# Patient Record
Sex: Male | Born: 1975 | Race: White | Hispanic: No | Marital: Married | Smoking: Current every day smoker
Health system: Southern US, Community
[De-identification: ages and names within clinical notes are randomized; demographics above are authoritative.]

## PROBLEM LIST (undated history)

## (undated) DIAGNOSIS — F419 Anxiety disorder, unspecified: Secondary | ICD-10-CM

## (undated) DIAGNOSIS — I10 Essential (primary) hypertension: Secondary | ICD-10-CM

## (undated) DIAGNOSIS — F32A Depression, unspecified: Secondary | ICD-10-CM

## (undated) DIAGNOSIS — F329 Major depressive disorder, single episode, unspecified: Secondary | ICD-10-CM

## (undated) DIAGNOSIS — K589 Irritable bowel syndrome without diarrhea: Secondary | ICD-10-CM

## (undated) DIAGNOSIS — G4730 Sleep apnea, unspecified: Secondary | ICD-10-CM

## (undated) DIAGNOSIS — K219 Gastro-esophageal reflux disease without esophagitis: Secondary | ICD-10-CM

## (undated) DIAGNOSIS — E785 Hyperlipidemia, unspecified: Secondary | ICD-10-CM

## (undated) HISTORY — DX: Hyperlipidemia, unspecified: E78.5

## (undated) HISTORY — PX: COLONOSCOPY WITH ESOPHAGOGASTRODUODENOSCOPY (EGD): SHX5779

## (undated) HISTORY — PX: HERNIA REPAIR: SHX51

---

## 2004-11-09 ENCOUNTER — Emergency Department: Payer: Self-pay | Admitting: Emergency Medicine

## 2004-11-10 ENCOUNTER — Inpatient Hospital Stay: Payer: Self-pay | Admitting: Internal Medicine

## 2004-11-10 ENCOUNTER — Other Ambulatory Visit: Payer: Self-pay

## 2006-11-09 ENCOUNTER — Emergency Department: Payer: Self-pay | Admitting: Emergency Medicine

## 2007-02-03 ENCOUNTER — Ambulatory Visit: Payer: Self-pay | Admitting: Internal Medicine

## 2007-08-02 ENCOUNTER — Emergency Department: Payer: Self-pay | Admitting: Emergency Medicine

## 2007-08-02 ENCOUNTER — Other Ambulatory Visit: Payer: Self-pay

## 2007-08-18 ENCOUNTER — Ambulatory Visit: Payer: Self-pay | Admitting: Gastroenterology

## 2009-05-17 ENCOUNTER — Emergency Department: Payer: Self-pay | Admitting: Emergency Medicine

## 2009-10-25 ENCOUNTER — Ambulatory Visit: Payer: Self-pay | Admitting: Gastroenterology

## 2012-01-31 ENCOUNTER — Ambulatory Visit: Payer: Self-pay | Admitting: Gastroenterology

## 2012-04-17 ENCOUNTER — Ambulatory Visit: Payer: Self-pay | Admitting: Internal Medicine

## 2012-10-06 ENCOUNTER — Ambulatory Visit: Payer: Self-pay | Admitting: Gastroenterology

## 2013-02-05 ENCOUNTER — Ambulatory Visit: Payer: Self-pay | Admitting: Anesthesiology

## 2013-02-10 ENCOUNTER — Ambulatory Visit: Payer: Self-pay | Admitting: Surgery

## 2013-10-02 DIAGNOSIS — G4733 Obstructive sleep apnea (adult) (pediatric): Secondary | ICD-10-CM | POA: Insufficient documentation

## 2013-10-02 DIAGNOSIS — K589 Irritable bowel syndrome without diarrhea: Secondary | ICD-10-CM | POA: Insufficient documentation

## 2013-10-02 DIAGNOSIS — I10 Essential (primary) hypertension: Secondary | ICD-10-CM | POA: Insufficient documentation

## 2013-10-02 DIAGNOSIS — F419 Anxiety disorder, unspecified: Secondary | ICD-10-CM | POA: Insufficient documentation

## 2014-05-15 NOTE — Op Note (Signed)
PATIENT NAME:  Steven Lucero, Steven Lucero MR#:  [id number] DATE OF BIRTH:  1975/05/15  DATE OF PROCEDURE:  02/10/2013  PREOPERATIVE DIAGNOSIS: Umbilical hernia.   POSTOPERATIVE DIAGNOSIS: Umbilical hernia.   PROCEDURE PERFORMED: Umbilical hernia repair.   SURGEON: Rochel Brome, M.D.   ANESTHESIA: General.   INDICATIONS: This 39 year old male has a recent history of umbilical pain. He described a cramping-type pain, hurts more with sitting, better when he lays down, and did have physical findings of umbilical hernia, which was palpable. The bulge itself was some 2 cm in dimension, and palpation reproduces pain and repair was recommended for definitive treatment.   DESCRIPTION OF PROCEDURE: The patient was placed on the operating table in the supine position under general anesthesia. The abdomen was prepared with ChloraPrep and draped in a sterile manner.   A transversely oriented supraumbilical incision was made some 5 cm in length, was transversely oriented and curvilinear and carried down through subcutaneous tissues. Several small bleeding points were cauterized and dissection was carried down to palpate the fascial defect. There was a hernia sac which was dissected free from surrounding structures. The sac was approximately 2 to 2.5 cm in length. It was dissected free from the skin of the umbilicus and was dissected free from the fascial ring defect and was inverted. The fascial ring defect appeared to have some satisfactory thickness of the fascia. The defect itself was approximately 1.2 cm in dimension. Next, polypropylene mesh was selected and cut to create a circular shape of some 1.5 cm in diameter. This was placed into the properitoneal plane and was sutured to the overlying fascia cranially and caudally with through and through 0 Surgilon. Next, the repair was carried out with a transversely oriented suture line of interrupted 0 Surgilon figure-of-eight sutures incorporating each suture into the  mesh. The repair looked good. Hemostasis was intact. Next, a pursestring suture was placed using 3-0 chromic in the subcutaneous tissues just about 2 cm superficial to the repair. Next, the skin of the umbilicus was sutured to the pursestring to tether the umbilicus. Next, the skin incision was closed with running 4-0 Monocryl subcuticular suture and Dermabond. The patient tolerated surgery satisfactorily and was then prepared for transfer to the recovery room.     ____________________________ Lenna Sciara. Rochel Brome, MD jws:dmm D: 02/10/2013 10:45:21 ET T: 02/10/2013 12:49:17 ET JOB#: [id number]  cc: Loreli Dollar, MD, <Dictator> Loreli Dollar MD ELECTRONICALLY SIGNED 02/11/2013 17:52

## 2014-09-30 ENCOUNTER — Other Ambulatory Visit: Payer: Self-pay | Admitting: Gastroenterology

## 2014-09-30 DIAGNOSIS — R1013 Epigastric pain: Secondary | ICD-10-CM

## 2014-09-30 DIAGNOSIS — R1310 Dysphagia, unspecified: Secondary | ICD-10-CM

## 2014-10-07 ENCOUNTER — Ambulatory Visit
Admission: RE | Admit: 2014-10-07 | Discharge: 2014-10-07 | Disposition: A | Discharge status: Home / Self Care | Payer: 59 | Source: Ambulatory Visit | Attending: Gastroenterology | Admitting: Gastroenterology

## 2014-10-07 DIAGNOSIS — R1013 Epigastric pain: Secondary | ICD-10-CM

## 2014-10-07 DIAGNOSIS — R1310 Dysphagia, unspecified: Secondary | ICD-10-CM

## 2014-10-07 DIAGNOSIS — K219 Gastro-esophageal reflux disease without esophagitis: Secondary | ICD-10-CM | POA: Diagnosis not present

## 2014-10-11 ENCOUNTER — Other Ambulatory Visit: Payer: Self-pay | Admitting: Gastroenterology

## 2014-10-11 DIAGNOSIS — R1084 Generalized abdominal pain: Secondary | ICD-10-CM

## 2014-10-15 ENCOUNTER — Ambulatory Visit
Admission: RE | Admit: 2014-10-15 | Discharge: 2014-10-15 | Disposition: A | Discharge status: Home / Self Care | Payer: 59 | Source: Ambulatory Visit | Attending: Gastroenterology | Admitting: Gastroenterology

## 2014-10-15 DIAGNOSIS — R1084 Generalized abdominal pain: Secondary | ICD-10-CM

## 2014-10-15 DIAGNOSIS — K760 Fatty (change of) liver, not elsewhere classified: Secondary | ICD-10-CM | POA: Insufficient documentation

## 2014-10-15 MED ORDER — TECHNETIUM TC 99M MEBROFENIN IV KIT
5.1900 | PACK | Freq: Once | INTRAVENOUS | Status: DC | PRN
  Administered 2014-10-15: 5.19 via INTRAVENOUS
  Filled 2014-10-15: qty 6

## 2014-11-05 ENCOUNTER — Encounter: Payer: Self-pay | Admitting: *Deleted

## 2014-11-08 ENCOUNTER — Ambulatory Visit
Admission: RE | Admit: 2014-11-08 | Discharge: 2014-11-08 | Disposition: A | Discharge status: Home / Self Care | Payer: 59 | Source: Ambulatory Visit | Attending: Gastroenterology | Admitting: Gastroenterology

## 2014-11-08 ENCOUNTER — Ambulatory Visit: Payer: 59 | Admitting: Anesthesiology

## 2014-11-08 ENCOUNTER — Encounter: Payer: Self-pay | Admitting: *Deleted

## 2014-11-08 ENCOUNTER — Encounter
Admission: RE | Disposition: A | Discharge status: Home / Self Care | Payer: Self-pay | Source: Ambulatory Visit | Attending: Gastroenterology

## 2014-11-08 DIAGNOSIS — F419 Anxiety disorder, unspecified: Secondary | ICD-10-CM | POA: Diagnosis not present

## 2014-11-08 DIAGNOSIS — K589 Irritable bowel syndrome without diarrhea: Secondary | ICD-10-CM | POA: Diagnosis not present

## 2014-11-08 DIAGNOSIS — Z87891 Personal history of nicotine dependence: Secondary | ICD-10-CM | POA: Diagnosis not present

## 2014-11-08 DIAGNOSIS — Z880 Allergy status to penicillin: Secondary | ICD-10-CM | POA: Diagnosis not present

## 2014-11-08 DIAGNOSIS — G4730 Sleep apnea, unspecified: Secondary | ICD-10-CM | POA: Insufficient documentation

## 2014-11-08 DIAGNOSIS — F329 Major depressive disorder, single episode, unspecified: Secondary | ICD-10-CM | POA: Insufficient documentation

## 2014-11-08 DIAGNOSIS — Z79899 Other long term (current) drug therapy: Secondary | ICD-10-CM | POA: Insufficient documentation

## 2014-11-08 DIAGNOSIS — K2950 Unspecified chronic gastritis without bleeding: Secondary | ICD-10-CM | POA: Diagnosis not present

## 2014-11-08 DIAGNOSIS — K3189 Other diseases of stomach and duodenum: Secondary | ICD-10-CM | POA: Diagnosis not present

## 2014-11-08 DIAGNOSIS — I10 Essential (primary) hypertension: Secondary | ICD-10-CM | POA: Diagnosis not present

## 2014-11-08 DIAGNOSIS — Z6841 Body Mass Index (BMI) 40.0 and over, adult: Secondary | ICD-10-CM | POA: Insufficient documentation

## 2014-11-08 DIAGNOSIS — K219 Gastro-esophageal reflux disease without esophagitis: Secondary | ICD-10-CM | POA: Insufficient documentation

## 2014-11-08 DIAGNOSIS — R1013 Epigastric pain: Secondary | ICD-10-CM | POA: Diagnosis present

## 2014-11-08 HISTORY — PX: ESOPHAGOGASTRODUODENOSCOPY (EGD) WITH PROPOFOL: SHX5813

## 2014-11-08 HISTORY — DX: Gastro-esophageal reflux disease without esophagitis: K21.9

## 2014-11-08 HISTORY — DX: Irritable bowel syndrome, unspecified: K58.9

## 2014-11-08 HISTORY — DX: Sleep apnea, unspecified: G47.30

## 2014-11-08 HISTORY — DX: Anxiety disorder, unspecified: F41.9

## 2014-11-08 HISTORY — DX: Major depressive disorder, single episode, unspecified: F32.9

## 2014-11-08 HISTORY — DX: Essential (primary) hypertension: I10

## 2014-11-08 HISTORY — DX: Depression, unspecified: F32.A

## 2014-11-08 SURGERY — ESOPHAGOGASTRODUODENOSCOPY (EGD) WITH PROPOFOL
Anesthesia: General

## 2014-11-08 MED ORDER — PROPOFOL 10 MG/ML IV BOLUS
INTRAVENOUS | Status: DC | PRN
  Administered 2014-11-08 (×11): 20 mg via INTRAVENOUS

## 2014-11-08 MED ORDER — SODIUM CHLORIDE 0.9 % IV SOLN
INTRAVENOUS | Status: DC
Start: 2014-11-08 — End: 2014-11-08

## 2014-11-08 MED ORDER — MIDAZOLAM HCL 2 MG/2ML IJ SOLN
INTRAMUSCULAR | Status: DC | PRN
  Administered 2014-11-08: 1 mg via INTRAVENOUS

## 2014-11-08 MED ORDER — SODIUM CHLORIDE 0.9 % IV SOLN
INTRAVENOUS | Status: DC
  Administered 2014-11-08: 11:00:00 via INTRAVENOUS

## 2014-11-08 MED ORDER — GLYCOPYRROLATE 0.2 MG/ML IJ SOLN
INTRAMUSCULAR | Status: DC | PRN
Start: 2014-11-08 — End: 2014-11-30
  Administered 2014-11-08: 0.4 mg via INTRAVENOUS

## 2014-11-08 NOTE — Op Note (Signed)
[facility] Gastroenterology Patient Name: Steven Lucero Procedure Date: 11/08/2014 11:06 AM MRN: [id number] Account #: [id number] Date of Birth: 19-Jan-1976 Admit Type: Outpatient Age: 39 Room: [facility] ENDO ROOM 3 Gender: Male Note Status: Finalized Procedure:         Upper GI endoscopy Indications:       Epigastric abdominal pain Providers:         Lollie Sails, MD Referring MD:      Caprice Renshaw (Referring MD) Medicines:         Monitored Anesthesia Care Complications:     No immediate complications. Procedure:         Pre-Anesthesia Assessment:                    - ASA Grade Assessment: III - A patient with severe                     systemic disease.                    After obtaining informed consent, the endoscope was passed                     under direct vision. Throughout the procedure, the                     patient's blood pressure, pulse, and oxygen saturations                     were monitored continuously. The Olympus GIF-160 endoscope                     (S#. [id number]) was introduced through the mouth, and                     advanced to the third part of duodenum. The upper GI                     endoscopy was accomplished without difficulty. The upper                     GI endoscopy was accomplished without difficulty. The                     patient tolerated the procedure well. Findings:      The examined esophagus was normal. Biopsies were taken with a cold       forceps for histology from the GE junction.      The entire examined stomach was normal. Biopsies were taken with a cold       forceps for histology. Biopsies were taken with a cold forceps for       Helicobacter pylori testing.      The cardia and gastric fundus were normal on retroflexion.      The examined duodenum was normal. Biopsies were taken with a cold       forceps for histology. Impression:        - Normal esophagus. Biopsied.                    - Normal  stomach. Biopsied.                    - Normal examined duodenum. Biopsied. Recommendation:    - Use Aciphex (rabeprazole) 20 mg PO BID.                    -  Use sucralfate tablets 1 gram PO QID daily.                    - Return to GI clinic in 1 month. Procedure Code(s): --- Professional ---                    [phone], Esophagogastroduodenoscopy, flexible, transoral;                     with biopsy, single or multiple Diagnosis Code(s): --- Professional ---                    789.06, Abdominal pain, epigastric CPT copyright 2014 American Medical Association. All rights reserved. The codes documented in this report are preliminary and upon coder review may  be revised to meet current compliance requirements. Lollie Sails, MD 11/08/2014 11:29:38 AM This report has been signed electronically. Number of Addenda: 0 Note Initiated On: 11/08/2014 11:06 AM      [facility]

## 2014-11-08 NOTE — H&P (Signed)
Outpatient short stay form Pre-procedure 11/08/2014 11:03 AM Lollie Sails MD  Primary Physician: Dr. Derinda Late  Reason for visit:  EGD  History of present illness:  Patient is a 39 year old male presenting with complaint of increasing epigastric pain over the period past 2 months. He is been tried on several medications that does not seem to be helping. Is burning in nature. There is some nausea. There's no emesis. It is nonradiating. He takes no aspirin or NSAID products. He takes no anticoagulation medications.  He had a upper GI series with barium swallow done on 10/07/2014 some mild gastroesophageal reflux otherwise normal. The standardized barium tablet passed into the stomach without difficulty.  Current facility-administered medications:  .  0.9 %  sodium chloride infusion, , Intravenous, Continuous, Lollie Sails, MD, Last Rate: 20 mL/hr at 11/08/14 1031 .  0.9 %  sodium chloride infusion, , Intravenous, Continuous, Lollie Sails, MD  Facility-Administered Medications Ordered in Other Encounters:  .  glycopyrrolate (ROBINUL) injection, , , Anesthesia Intra-op, Molli Barrows, MD, 0.4 mg at 11/08/14 1053  Prescriptions prior to admission  Medication Sig Dispense Refill Last Dose  . amLODipine (NORVASC) 5 MG tablet Take 5 mg by mouth daily.   11/07/2014 at 2100  . buPROPion (WELLBUTRIN) 100 MG tablet Take 100 mg by mouth 2 (two) times daily.   11/07/2014 at Unknown time  . clomiPHENE (CLOMID) 50 MG tablet Take by mouth daily.   11/07/2014 at Unknown time  . fluticasone (FLONASE) 50 MCG/ACT nasal spray Place into both nostrils daily.   11/07/2014 at Unknown time  . lansoprazole (PREVACID) 30 MG capsule Take 30 mg by mouth daily at 12 noon.   11/07/2014 at Unknown time  . losartan-hydrochlorothiazide (HYZAAR) 50-12.5 MG tablet Take 1 tablet by mouth daily.   11/08/2014 at 0600  . Probiotic Product (ALIGN) 4 MG CAPS Take 4 mg by mouth.   11/07/2014 at Unknown time  .  ranitidine (ZANTAC) 150 MG capsule Take 150 mg by mouth 2 (two) times daily.   11/07/2014 at 0600  . escitalopram (LEXAPRO) 20 MG tablet Take 20 mg by mouth daily.   Not Taking  . pantoprazole (PROTONIX) 40 MG tablet Take 40 mg by mouth daily.   Completed Course at Unknown time  . sucralfate (CARAFATE) 1 G tablet Take 1 g by mouth 4 (four) times daily -  with meals and at bedtime.   Not Taking at Unknown time  . varenicline (CHANTIX) 0.5 MG tablet Take 0.5 mg by mouth 2 (two) times daily.   Not Taking at Unknown time     Allergies  Allergen Reactions  . Augmentin [Amoxicillin-Pot Clavulanate]   . Penicillins      Past Medical History  Diagnosis Date  . Hypertension   . GERD (gastroesophageal reflux disease)   . Anxiety   . Sleep apnea   . IBS (irritable bowel syndrome)   . Depression     Review of systems:      Physical Exam    Heart and lungs: Regular rate and rhythm without rub or gallop, lungs are bilaterally clear    HEENT: Normocephalic atraumatic eyes are anicteric    Other:     Pertinant exam for procedure: Obese, mild tenderness to palpation the epigastric region. It is not possible to palpate internal organs. Bowel sounds are positive normoactive. There is no rebound.    Planned proceedures: EGD and indicated procedures I have discussed the risks benefits and complications of procedures to  include not limited to bleeding, infection, perforation and the risk of sedation and the patient wishes to proceed.    Lollie Sails, MD Gastroenterology 11/08/2014  11:03 AM

## 2014-11-08 NOTE — Anesthesia Preprocedure Evaluation (Addendum)
Anesthesia Evaluation  Patient identified by MRN, date of birth, ID band Patient awake    Reviewed: Allergy & Precautions, H&P , NPO status , Patient's Chart, lab work & pertinent test results, reviewed documented beta blocker date and time   Airway Mallampati: II  TM Distance: >3 FB Neck ROM: full    Dental no notable dental hx.    Pulmonary neg pulmonary ROS, sleep apnea , former smoker,    Pulmonary exam normal breath sounds clear to auscultation       Cardiovascular Exercise Tolerance: Good hypertension, negative cardio ROS   Rhythm:regular Rate:Normal     Neuro/Psych PSYCHIATRIC DISORDERS negative neurological ROS  negative psych ROS   GI/Hepatic negative GI ROS, Neg liver ROS, GERD  ,  Endo/Other  negative endocrine ROSMorbid obesity  Renal/GU negative Renal ROS  negative genitourinary   Musculoskeletal   Abdominal   Peds  Hematology negative hematology ROS (+)   Anesthesia Other Findings   Reproductive/Obstetrics negative OB ROS                           Anesthesia Physical Anesthesia Plan  ASA: III  Anesthesia Plan: General   Post-op Pain Management:    Induction:   Airway Management Planned:   Additional Equipment:   Intra-op Plan:   Post-operative Plan:   Informed Consent: I have reviewed the patients History and Physical, chart, labs and discussed the procedure including the risks, benefits and alternatives for the proposed anesthesia with the patient or authorized representative who has indicated his/her understanding and acceptance.   Dental Advisory Given  Plan Discussed with: CRNA  Anesthesia Plan Comments:         Anesthesia Quick Evaluation

## 2014-11-09 LAB — SURGICAL PATHOLOGY

## 2014-11-10 ENCOUNTER — Encounter: Payer: Self-pay | Admitting: Gastroenterology

## 2014-11-19 ENCOUNTER — Other Ambulatory Visit: Payer: Self-pay | Admitting: Gastroenterology

## 2014-11-19 DIAGNOSIS — R109 Unspecified abdominal pain: Secondary | ICD-10-CM

## 2014-11-30 ENCOUNTER — Ambulatory Visit: Payer: 59

## 2014-11-30 ENCOUNTER — Encounter: Payer: Self-pay | Admitting: Gastroenterology

## 2014-11-30 NOTE — Anesthesia Postprocedure Evaluation (Signed)
  Anesthesia Post-op Note  Patient: Steven Lucero  Procedure(s) Performed: Procedure(s): ESOPHAGOGASTRODUODENOSCOPY (EGD) WITH PROPOFOL (N/A)  Anesthesia type:General  Patient location: PACU  Post pain: Pain level controlled  Post assessment: Post-op Vital signs reviewed, Patient's Cardiovascular Status Stable, Respiratory Function Stable, Patent Airway and No signs of Nausea or vomiting  Post vital signs: Reviewed and stable  Last Vitals:  Filed Vitals:   11/08/14 1210  BP: 126/67  Pulse: 62  Temp:   Resp: 21    Level of consciousness: awake, alert  and patient cooperative  Complications: No apparent anesthesia complications

## 2014-11-30 NOTE — Transfer of Care (Signed)
Immediate Anesthesia Transfer of Care Note  Patient: Steven Lucero  Procedure(s) Performed: Procedure(s): ESOPHAGOGASTRODUODENOSCOPY (EGD) WITH PROPOFOL (N/A)  Patient Location: PACU and Endoscopy Unit    Anesthesia Type:General  Level of Consciousness: awake  Airway & Oxygen Therapy: Patient Spontanous Breathing  Post-op Assessment: Report given to RN and Post -op Vital signs reviewed and stable  Post vital signs: Reviewed  Last Vitals:  Filed Vitals:   11/08/14 1210  BP: 126/67  Pulse: 62  Temp:   Resp: 21    Complications: No apparent anesthesia complications

## 2014-12-02 ENCOUNTER — Other Ambulatory Visit: Payer: Self-pay | Admitting: Internal Medicine

## 2014-12-02 DIAGNOSIS — R109 Unspecified abdominal pain: Secondary | ICD-10-CM

## 2014-12-02 DIAGNOSIS — R0602 Shortness of breath: Secondary | ICD-10-CM

## 2014-12-02 DIAGNOSIS — G4730 Sleep apnea, unspecified: Secondary | ICD-10-CM

## 2014-12-09 ENCOUNTER — Encounter
Admission: RE | Admit: 2014-12-09 | Discharge: 2014-12-09 | Disposition: A | Discharge status: Home / Self Care | Payer: 59 | Source: Ambulatory Visit | Attending: Internal Medicine | Admitting: Internal Medicine

## 2014-12-09 ENCOUNTER — Encounter: Admission: RE | Admit: 2014-12-09 | Payer: 59 | Source: Ambulatory Visit

## 2014-12-09 DIAGNOSIS — G4730 Sleep apnea, unspecified: Secondary | ICD-10-CM | POA: Diagnosis present

## 2014-12-09 DIAGNOSIS — R109 Unspecified abdominal pain: Secondary | ICD-10-CM | POA: Insufficient documentation

## 2014-12-09 DIAGNOSIS — R0602 Shortness of breath: Secondary | ICD-10-CM | POA: Diagnosis not present

## 2014-12-09 MED ORDER — TECHNETIUM TC 99M SESTAMIBI - CARDIOLITE
30.0000 | Freq: Once | INTRAVENOUS | Status: AC | PRN
  Administered 2014-12-09: 32.61 via INTRAVENOUS

## 2014-12-09 MED ORDER — REGADENOSON 0.4 MG/5ML IV SOLN
0.4000 mg | Freq: Once | INTRAVENOUS | Status: AC
  Administered 2014-12-09: 0.4 mg via INTRAVENOUS

## 2014-12-10 ENCOUNTER — Encounter
Admission: RE | Admit: 2014-12-10 | Discharge: 2014-12-10 | Disposition: A | Discharge status: Home / Self Care | Payer: 59 | Source: Ambulatory Visit | Attending: Internal Medicine | Admitting: Internal Medicine

## 2014-12-10 DIAGNOSIS — R0602 Shortness of breath: Secondary | ICD-10-CM | POA: Diagnosis not present

## 2014-12-10 MED ORDER — TECHNETIUM TC 99M SESTAMIBI - CARDIOLITE
30.0000 | Freq: Once | INTRAVENOUS | Status: AC | PRN
  Administered 2014-12-10: 28.65 via INTRAVENOUS

## 2014-12-11 LAB — NM MYOCAR MULTI W/SPECT W/WALL MOTION / EF
CHL CUP NUCLEAR SDS: 13
CHL CUP NUCLEAR SRS: 9
CHL CUP NUCLEAR SSS: 21
LV sys vol: 72 mL
LVDIAVOL: 148 mL
NUC STRESS TID: 1.24
Peak HR: 100 {beats}/min
Rest HR: 68 {beats}/min

## 2014-12-15 ENCOUNTER — Ambulatory Visit: Payer: 59

## 2014-12-17 ENCOUNTER — Ambulatory Visit
Admission: RE | Admit: 2014-12-17 | Discharge: 2014-12-17 | Disposition: A | Discharge status: Home / Self Care | Payer: 59 | Source: Ambulatory Visit | Attending: Gastroenterology | Admitting: Gastroenterology

## 2014-12-17 DIAGNOSIS — R109 Unspecified abdominal pain: Secondary | ICD-10-CM | POA: Diagnosis present

## 2014-12-17 MED ORDER — IOHEXOL 350 MG/ML SOLN
150.0000 mL | Freq: Once | INTRAVENOUS | Status: AC | PRN
  Administered 2014-12-17: 125 mL via INTRAVENOUS

## 2015-01-11 ENCOUNTER — Ambulatory Visit (INDEPENDENT_AMBULATORY_CARE_PROVIDER_SITE_OTHER): Payer: 59 | Admitting: Licensed Clinical Social Worker

## 2015-01-11 DIAGNOSIS — F329 Major depressive disorder, single episode, unspecified: Secondary | ICD-10-CM

## 2015-01-11 DIAGNOSIS — F411 Generalized anxiety disorder: Secondary | ICD-10-CM

## 2015-01-11 DIAGNOSIS — F32A Depression, unspecified: Secondary | ICD-10-CM
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
## 2015-02-03 ENCOUNTER — Ambulatory Visit (INDEPENDENT_AMBULATORY_CARE_PROVIDER_SITE_OTHER): Payer: 59 | Admitting: Psychiatry

## 2015-02-03 ENCOUNTER — Encounter: Payer: Self-pay | Admitting: Psychiatry

## 2015-02-03 VITALS — BP 130/98 | HR 102 | Temp 97.2°F | Ht 76.0 in | Wt >= 6400 oz

## 2015-02-03 DIAGNOSIS — F331 Major depressive disorder, recurrent, moderate: Secondary | ICD-10-CM | POA: Diagnosis not present

## 2015-02-03 MED ORDER — BUPROPION HCL ER (XL) 300 MG PO TB24: 300.0000 mg | ORAL_TABLET | Freq: Every day | ORAL | Status: DC

## 2015-02-03 MED ORDER — DULOXETINE HCL 30 MG PO CPEP: 30.0000 mg | ORAL_CAPSULE | Freq: Two times a day (BID) | ORAL | Status: DC

## 2015-02-03 NOTE — Progress Notes (Signed)
Psychiatric Initial Adult Assessment   Patient Identification: Steven Lucero MRN:  [id number] Date of Evaluation:  02/03/2015 Referral Source: Elmyra Ricks- Therapist Chief Complaint:   Chief Complaint    Establish Care; Obesity; Anxiety; Depression; Stress     Visit Diagnosis:    ICD-9-CM ICD-10-CM   1. MDD (major depressive disorder), recurrent episode, moderate (HCC) 296.32 F33.1    Diagnosis:   Patient Active Problem List   Diagnosis Date Noted  . Anxiety [F41.9] 10/02/2013  . Benign essential HTN [I10] 10/02/2013  . 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 Obstructive apnea [G47.33] 10/02/2013   History of Present Illness:  Patient is a 40 year old morbidly obese male who currently works at "presented for initial assessment. He reported that he is coming here for the evaluation of his anxiety symptoms and negative thoughts about himself. He reported that he is currently taking medications including Lexapro and Wellbutrin but has not taking his Lexapro in the past 2 weeks. He reported that he was having adverse effects from Lexapro including sexual dysfunction and weight gain. He is currently overweight and he related that he has gained over 60 pounds by taking Lexapro. Patient reported that his friend has also been taking Lexapro and he has gained some weight on the medication. Patient reported that he was suggested to come for the psychiatric evaluation so he can have a second opinion about his medications. 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 He reported that he has not changed anything else in his life. He has good relationship with his wife who also works in [employer]. a but in a different office. Patient reported that he has difficult time sleeping and keeps thinking about different things. He currently denied using any drugs or alcohol. He denied having any suicidal homicidal ideations or  plans. He appeared calm and coherent during the interview.   Elements:  Severity:  mild. Associated Signs/Symptoms: Depression Symptoms:  anhedonia, insomnia, psychomotor retardation, fatigue, feelings of worthlessness/guilt, hopelessness, anxiety, panic attacks, loss of energy/fatigue, disturbed sleep, (Hypo) Manic Symptoms:  Flight of Ideas, Anxiety Symptoms:  Excessive Worry, Panic Symptoms, Psychotic Symptoms:  none PTSD Symptoms: Negative NA  Past Medical History:  Past Medical History  Diagnosis Date  . Hypertension   . GERD (gastroesophageal reflux disease)   . Anxiety   . Sleep apnea   . IBS (irritable bowel syndrome)   . Depression   . Hyperlipidemia     Past Surgical History  Procedure Laterality Date  . Hernia repair    . Colonoscopy with esophagogastroduodenoscopy (egd)    . Esophagogastroduodenoscopy (egd) with propofol N/A 11/08/2014    Procedure: ESOPHAGOGASTRODUODENOSCOPY (EGD) WITH PROPOFOL;  Surgeon: Lollie Sails, MD;  Location: [facility] ENDOSCOPY;  Service: Endoscopy;  Laterality: N/A;   Family History:  Family History  Problem Relation Age of Onset  . Heart attack Father   . Heart disease Father   . Diabetes Father    Social History:   Social History   Social History  . Marital Status: Married    Spouse Name: N/A  . Number of Children: N/A  . Years of Education: N/A   Social History Main Topics  . Smoking status: Current Every Day Smoker    Types: E-cigarettes  . Smokeless tobacco: Current User    Types: Snuff  . Alcohol Use: No  . Drug Use: No  . Sexual Activity: Yes  Birth Control/ Protection: None   Other Topics Concern  . 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  Married x 11 years Has a 21 years yo. She works at [employer] as well.   Musculoskeletal: Strength & Muscle Tone: within normal limits Gait & Station: normal Patient  leans: N/A  Psychiatric Specialty Exam: HPI  ROS  Blood pressure 130/98, pulse 102, temperature 97.2 F (36.2 C), temperature source Tympanic, height 6\' 4"  (1.93 m), weight 419 lb 9.6 oz (190.329 kg), SpO2 95 %.Body mass index is 51.1 kg/(m^2).  General Appearance: Casual  Eye Contact:  Fair  Speech:  Clear and Coherent  Volume:  Normal  Mood:  Anxious  Affect:  Congruent  Thought Process:  Coherent  Orientation:  Full (Time, Place, and Person)  Thought Content:  WDL  Suicidal Thoughts:  No  Homicidal Thoughts:  No  Memory:  Immediate;   Fair  Judgement:  Fair  Insight:  Fair  Psychomotor Activity:  Normal  Concentration:  Fair  Recall:  [employer] of Knowledge:Fair  Language: Fair  Akathisia:  No  Handed:  Right  AIMS (if indicated):    Assets:  Communication Skills Desire for Improvement Physical Health Social Support  ADL's:  Intact  Cognition: WNL    Sleep: " Not great "   Is the patient at risk to self?  No. Has the patient been a risk to self in the past 6 months?  No. Has the patient been a risk to self within the distant past?  No. Is the patient a risk to others?  No. Has the patient been a risk to others in the past 6 months?  No. Has the patient been a risk to others within the distant past?  No.  Allergies:   Allergies  Allergen Reactions  . Augmentin [Amoxicillin-Pot Clavulanate]   . Penicillin G Other (See Comments)  . Penicillins    Current Medications: Current Outpatient Prescriptions  Medication Sig Dispense Refill  . amLODipine (NORVASC) 5 MG tablet Take 5 mg by mouth daily.    Marland Kitchen buPROPion (WELLBUTRIN XL) 300 MG 24 hr tablet TAKE 1 TABLET (300 MG TOTAL) BY MOUTH ONCE DAILY.  1  . escitalopram (LEXAPRO) 20 MG tablet Take 20 mg by mouth daily.    Marland Kitchen losartan-hydrochlorothiazide (HYZAAR) 50-12.5 MG tablet Take 1 tablet by mouth daily.    . Probiotic Product (ALIGN) 4 MG CAPS Take 4 mg by mouth.    . RABEprazole (ACIPHEX) 20 MG tablet TAKE 1  TAB 30-45 MIN BEFORE BREAKFAST & 1 TAB 30-45 MIN BEFORE DINNER FOR 1 MONTH THEN ONCE A DAY  2  . ranitidine (ZANTAC) 150 MG capsule Take 150 mg by mouth 2 (two) times daily.    . sucralfate (CARAFATE) 1 G tablet Take 1 g by mouth 4 (four) times daily -  with meals and at bedtime.     No current facility-administered medications for this visit.    Previous Psychotropic Medications: Buspar, Zoloft, Paroxetine, Lexapro. Xanax.   Substance Abuse History in the last 12 months:  No.  Consequences of Substance Abuse: Negative NA  Medical Decision Making:  Review of Psycho-Social Stressors (1)  Treatment Plan Summary: Medication management   Discussed with patient at length about the several different treatment options. He reported that he wants to try a different medication. I discussed with him about the option of starting Pristiq or Cymbalta.  Pristiq is nonformulary with his insurance. I will start him on Cymbalta 30 mg for 2 days and then titrate to 60 mg daily. We discussed about the side effects in detail and he became concerned about the side effects due to his negative thoughts. However he agreed to the trial of the medication. He will continue on Wellbutrin XL 300 mg in the morning as he reported that it is helping him with his tobacco cessation. He will follow up in 4-5 weeks.    Rainey Pines, MD    1/12/20173:17 PM

## 2015-02-07 ENCOUNTER — Ambulatory Visit: Payer: Self-pay | Admitting: Psychiatry

## 2015-02-09 ENCOUNTER — Telehealth: Payer: Self-pay

## 2015-02-09 NOTE — Telephone Encounter (Signed)
pt called states that the wellbutrin is not helping with the stop smoking.    and he is also having an issue with the cymbalta, he is feeling strange , tired, sleepy, shaking wants to know if he can stop these medications?    also wants to know if he need to go back on lexapro

## 2015-02-10 MED ORDER — HYDROXYZINE PAMOATE 25 MG PO CAPS
100.0000 mg | ORAL_CAPSULE | Freq: Two times a day (BID) | ORAL | Status: DC | PRN
Start: 2015-02-10 — End: 2015-10-13

## 2015-02-10 NOTE — Telephone Encounter (Signed)
Called pt. He stated that he is feeling tired since starting Cymbalta and taking both Cymbalta and Wellbutrin at same time in am. Advised him to take Cymbalta in pm. He was asking about Lexapro which he has not taken in 3 weeks. Advised him to continue with Cymbalta at this time and he agreed with the plan.  He was asking for a prescription of Xanax for anxiety.  Offered Vistaril 25mg  po BID prn for anxiety and he agreed. Prescription sent to Pharmacy.  Will follow.

## 2015-02-10 NOTE — Telephone Encounter (Signed)
pt called wanted to know if it was ok to stop the medication. He states he had a hard time last night and he not going to work today he doesn't feel like he can drive.  pt want to know if ok to just stop medication or does he need to tritrate off

## 2015-02-14 ENCOUNTER — Telehealth: Payer: Self-pay

## 2015-02-14 NOTE — Telephone Encounter (Signed)
pt called states he wanted to speak back with dr. Gretel Acre.  he states he left a message on dr. Gretel Acre voice mail. pt states he is not doing good and wants to speak back with dr. Gretel Acre

## 2015-02-14 NOTE — Telephone Encounter (Signed)
left message to call office back, with more details of what is going on with medications, advised to make appt to discuss issues

## 2015-06-27 ENCOUNTER — Encounter: Payer: Self-pay | Admitting: *Deleted

## 2015-06-28 ENCOUNTER — Ambulatory Visit
Admission: RE | Admit: 2015-06-28 | Discharge: 2015-06-28 | Disposition: A | Discharge status: Home / Self Care | Payer: 59 | Source: Ambulatory Visit | Attending: Gastroenterology | Admitting: Gastroenterology

## 2015-06-28 ENCOUNTER — Ambulatory Visit: Payer: 59 | Admitting: Anesthesiology

## 2015-06-28 ENCOUNTER — Encounter
Admission: RE | Disposition: A | Discharge status: Home / Self Care | Payer: Self-pay | Source: Ambulatory Visit | Attending: Gastroenterology

## 2015-06-28 DIAGNOSIS — E785 Hyperlipidemia, unspecified: Secondary | ICD-10-CM | POA: Diagnosis not present

## 2015-06-28 DIAGNOSIS — K219 Gastro-esophageal reflux disease without esophagitis: Secondary | ICD-10-CM | POA: Diagnosis not present

## 2015-06-28 DIAGNOSIS — I10 Essential (primary) hypertension: Secondary | ICD-10-CM | POA: Diagnosis not present

## 2015-06-28 DIAGNOSIS — K589 Irritable bowel syndrome without diarrhea: Secondary | ICD-10-CM | POA: Diagnosis not present

## 2015-06-28 DIAGNOSIS — G4730 Sleep apnea, unspecified: Secondary | ICD-10-CM | POA: Diagnosis not present

## 2015-06-28 DIAGNOSIS — Z79899 Other long term (current) drug therapy: Secondary | ICD-10-CM | POA: Diagnosis not present

## 2015-06-28 DIAGNOSIS — F329 Major depressive disorder, single episode, unspecified: Secondary | ICD-10-CM | POA: Diagnosis not present

## 2015-06-28 DIAGNOSIS — F17200 Nicotine dependence, unspecified, uncomplicated: Secondary | ICD-10-CM | POA: Insufficient documentation

## 2015-06-28 DIAGNOSIS — R194 Change in bowel habit: Secondary | ICD-10-CM | POA: Diagnosis not present

## 2015-06-28 DIAGNOSIS — R1084 Generalized abdominal pain: Secondary | ICD-10-CM | POA: Diagnosis not present

## 2015-06-28 DIAGNOSIS — Z880 Allergy status to penicillin: Secondary | ICD-10-CM | POA: Diagnosis not present

## 2015-06-28 DIAGNOSIS — F419 Anxiety disorder, unspecified: Secondary | ICD-10-CM | POA: Diagnosis not present

## 2015-06-28 HISTORY — PX: COLONOSCOPY WITH PROPOFOL: SHX5780

## 2015-06-28 SURGERY — COLONOSCOPY WITH PROPOFOL
Anesthesia: General

## 2015-06-28 MED ORDER — SODIUM CHLORIDE 0.9 % IV SOLN
INTRAVENOUS | Status: DC
  Administered 2015-06-28 (×2): via INTRAVENOUS

## 2015-06-28 MED ORDER — PROPOFOL 500 MG/50ML IV EMUL
INTRAVENOUS | Status: DC | PRN
  Administered 2015-06-28: 125 ug/kg/min via INTRAVENOUS

## 2015-06-28 MED ORDER — FENTANYL CITRATE (PF) 100 MCG/2ML IJ SOLN
INTRAMUSCULAR | Status: DC | PRN
  Administered 2015-06-28: 50 ug via INTRAVENOUS

## 2015-06-28 MED ORDER — PROPOFOL 10 MG/ML IV BOLUS
INTRAVENOUS | Status: DC | PRN
  Administered 2015-06-28: 80 mg via INTRAVENOUS

## 2015-06-28 MED ORDER — MIDAZOLAM HCL 2 MG/2ML IJ SOLN
INTRAMUSCULAR | Status: DC | PRN
  Administered 2015-06-28: 1 mg via INTRAVENOUS

## 2015-06-28 MED ORDER — SODIUM CHLORIDE 0.9 % IV SOLN: INTRAVENOUS | Status: DC

## 2015-06-28 NOTE — Anesthesia Procedure Notes (Signed)
Date/Time: 06/28/2015 10:07 AM Performed by: Nelda Marseille Pre-anesthesia Checklist: Emergency Drugs available, Patient identified, Suction available, Patient being monitored and Timeout performed Oxygen Delivery Method: Simple face mask Comments: 10L flow via simple face mask

## 2015-06-28 NOTE — Transfer of Care (Signed)
Immediate Anesthesia Transfer of Care Note  Patient: Steven Lucero  Procedure(s) Performed: Procedure(s): COLONOSCOPY WITH PROPOFOL (N/A)  Patient Location: PACU  Anesthesia Type:General  Level of Consciousness: sedated  Airway & Oxygen Therapy: Patient Spontanous Breathing and Patient connected to face mask oxygen  Post-op Assessment: Report given to RN and Post -op Vital signs reviewed and stable  Post vital signs: Reviewed and stable  Last Vitals:  Filed Vitals:   06/28/15 0826 06/28/15 1027  BP: 155/91   Pulse: 86   Temp: 36.1 C 36.6 C  Resp: 16     Last Pain: There were no vitals filed for this visit.       Complications: No apparent anesthesia complications

## 2015-06-28 NOTE — Op Note (Addendum)
[facility] Gastroenterology Patient Name: Steven Lucero Procedure Date: 06/28/2015 9:48 AM MRN: [id number] Account #: [id number] Date of Birth: 1975/04/12 Admit Type: Outpatient Age: 41 Room: [facility] ENDO ROOM 2 Gender: Male Note Status: Supervisor Override Procedure:            Colonoscopy Indications:          Generalized abdominal pain, Change in bowel habits Providers:            Lollie Sails, MD Referring MD:         Caprice Renshaw (Referring MD) Medicines:            Monitored Anesthesia Care Complications:        No immediate complications. Procedure:            Pre-Anesthesia Assessment:                       - ASA Grade Assessment: III - A patient with severe                        systemic disease.                       After obtaining informed consent, the colonoscope was                        passed under direct vision. Throughout the procedure,                        the patient's blood pressure, pulse, and oxygen                        saturations were monitored continuously. The                        Colonoscope was introduced through the anus and                        advanced to the the cecum, identified by appendiceal                        orifice and ileocecal valve. The colonoscopy was                        performed without difficulty. The patient tolerated the                        procedure well. The quality of the bowel preparation                        was good. The quality of the bowel preparation was good. Findings:      prominant colonic vasculature in the left colon, moreso distally-variant.      Biopsies for histology were taken with a cold forceps from the right       colon and left colon for evaluation of microscopic colitis.      The retroflexed view of the distal rectum and anal verge was normal and       showed no anal or rectal abnormalities.      The exam was otherwise without abnormality.      The digital rectal  exam was normal.  The perianal and digital rectal examinations were normal. Impression:           - The distal rectum and anal verge are normal on                        retroflexion view.                       - The examination was otherwise normal.                       - Biopsies were taken with a cold forceps from the                        right colon and left colon for evaluation of                        microscopic colitis. Recommendation:       - trial perirectal topical application of Vitamin A+D                        ointment. Consider bentyl for continued abdominal                        discomfort. Lollie Sails, MD 06/28/2015 10:24:39 AM This report has been signed electronically. Number of Addenda: 0 Note Initiated On: 06/28/2015 9:48 AM Scope Withdrawal Time: 0 hours 7 minutes 20 seconds  Total Procedure Duration: 0 hours 21 minutes 53 seconds       [facility]

## 2015-06-28 NOTE — Anesthesia Preprocedure Evaluation (Signed)
Anesthesia Evaluation  Patient identified by MRN, date of birth, ID band Patient awake    Reviewed: Allergy & Precautions, H&P , NPO status , Patient's Chart, lab work & pertinent test results, reviewed documented beta blocker date and time   Airway Mallampati: II   Neck ROM: full    Dental  (+) Poor Dentition   Pulmonary neg pulmonary ROS, Current Smoker,    Pulmonary exam normal        Cardiovascular hypertension, negative cardio ROS Normal cardiovascular exam Rhythm:regular Rate:Normal     Neuro/Psych negative neurological ROS  negative psych ROS   GI/Hepatic negative GI ROS, Neg liver ROS, GERD  ,  Endo/Other  negative endocrine ROS  Renal/GU negative Renal ROS  negative genitourinary   Musculoskeletal   Abdominal   Peds  Hematology negative hematology ROS (+)   Anesthesia Other Findings Past Medical History:   Hypertension                                                 GERD (gastroesophageal reflux disease)                       Anxiety                                                      Sleep apnea                                                  IBS (irritable bowel syndrome)                               Depression                                                   Hyperlipidemia                                             Past Surgical History:   HERNIA REPAIR                                                 COLONOSCOPY WITH ESOPHAGOGASTRODUODENOSCOPY (E*               ESOPHAGOGASTRODUODENOSCOPY (EGD) WITH PROPOFOL  N/A 11/08/2014     Comment:Procedure: ESOPHAGOGASTRODUODENOSCOPY (EGD)               WITH PROPOFOL;  Surgeon: Lollie Sails, MD;              Location: [facility] ENDOSCOPY;  Service: Endoscopy;  Laterality: N/A; BMI    Body Mass Index   50.62 kg/m 2     Reproductive/Obstetrics                             Anesthesia Physical Anesthesia Plan  ASA:  III  Anesthesia Plan: General   Post-op Pain Management:    Induction:   Airway Management Planned:   Additional Equipment:   Intra-op Plan:   Post-operative Plan:   Informed Consent: I have reviewed the patients History and Physical, chart, labs and discussed the procedure including the risks, benefits and alternatives for the proposed anesthesia with the patient or authorized representative who has indicated his/her understanding and acceptance.   Dental Advisory Given  Plan Discussed with: CRNA  Anesthesia Plan Comments:         Anesthesia Quick Evaluation

## 2015-06-28 NOTE — H&P (Signed)
Outpatient short stay form Pre-procedure 06/28/2015 9:53 AM Lollie Sails MD  Primary Physician: Dr Derinda Late  Reason for visit:  Colonoscopy  History of present illness:  Patient is a 40 year old male presenting today for colonoscopy. He's had problems with generalized abdominal discomfort as well as irregular bowel habits and some intermittent rectal bleeding. This is small volumes. He does take an SSRI. He takes other medications irregularly. He denies use of any blood thinning agents or aspirin products. He takes no NSAIDs.    Current facility-administered medications:  .  0.9 %  sodium chloride infusion, , Intravenous, Continuous, Lollie Sails, MD, Last Rate: 20 mL/hr at 06/28/15 0837 .  0.9 %  sodium chloride infusion, , Intravenous, Continuous, Lollie Sails, MD  Prescriptions prior to admission  Medication Sig Dispense Refill Last Dose  . amLODipine (NORVASC) 5 MG tablet Take 5 mg by mouth daily.   06/27/2015 at Unknown time  . buPROPion (WELLBUTRIN XL) 300 MG 24 hr tablet Take 1 tablet (300 mg total) by mouth daily. 90 tablet 1 06/27/2015 at Unknown time  . DULoxetine (CYMBALTA) 30 MG capsule Take 1 capsule (30 mg total) by mouth 2 (two) times daily. 60 capsule 1 06/27/2015 at Unknown time  . hydrOXYzine (VISTARIL) 25 MG capsule Take 4 capsules (100 mg total) by mouth 2 (two) times daily between meals as needed for anxiety. 60 capsule 0 06/27/2015 at Unknown time  . losartan-hydrochlorothiazide (HYZAAR) 50-12.5 MG tablet Take 1 tablet by mouth daily.   06/28/2015 at 0730  . Probiotic Product (ALIGN) 4 MG CAPS Take 4 mg by mouth.   06/27/2015 at Unknown time  . ranitidine (ZANTAC) 150 MG capsule Take 150 mg by mouth 2 (two) times daily.   06/27/2015 at Unknown time  . sucralfate (CARAFATE) 1 G tablet Take 1 g by mouth 4 (four) times daily -  with meals and at bedtime.   06/27/2015 at Unknown time  . RABEprazole (ACIPHEX) 20 MG tablet Reported on 06/28/2015  2 Not Taking at Unknown  time     Allergies  Allergen Reactions  . Augmentin [Amoxicillin-Pot Clavulanate]   . Penicillin G Other (See Comments)  . Penicillins      Past Medical History  Diagnosis Date  . Hypertension   . GERD (gastroesophageal reflux disease)   . Anxiety   . Sleep apnea   . IBS (irritable bowel syndrome)   . Depression   . Hyperlipidemia     Review of systems:      Physical Exam    Heart and lungs: Regular rate and rhythm without rub or gallop, lungs are bilaterally clear.    HEENT: Normocephalic atraumatic eyes are anicteric    Other:     Pertinant exam for procedure: Soft nontender nondistended bowel sounds positive normoactive, unable to palpate internal organs, obesity.    Planned proceedures: Colonoscopy and indicated procedures. I have discussed the risks benefits and complications of procedures to include not limited to bleeding, infection, perforation and the risk of sedation and the patient wishes to proceed. I have discussed the risks benefits and complications of procedures to include not limited to bleeding, infection, perforation and the risk of sedation and the patient wishes to proceed.    Lollie Sails, MD Gastroenterology 06/28/2015  9:53 AM

## 2015-06-29 ENCOUNTER — Encounter: Payer: Self-pay | Admitting: Gastroenterology

## 2015-06-29 LAB — SURGICAL PATHOLOGY

## 2015-06-29 NOTE — Anesthesia Postprocedure Evaluation (Signed)
Anesthesia Post Note  Patient: Steven Lucero  Procedure(s) Performed: Procedure(s) (LRB): COLONOSCOPY WITH PROPOFOL (N/A)  Patient location during evaluation: PACU Anesthesia Type: General Level of consciousness: awake and alert Pain management: pain level controlled Vital Signs Assessment: post-procedure vital signs reviewed and stable Respiratory status: spontaneous breathing, nonlabored ventilation, respiratory function stable and patient connected to nasal cannula oxygen Cardiovascular status: blood pressure returned to baseline and stable Postop Assessment: no signs of nausea or vomiting Anesthetic complications: no    Last Vitals:  Filed Vitals:   06/28/15 1057 06/28/15 1100  BP: 111/56 113/58  Pulse: 30 71  Temp:    Resp: 14 21    Last Pain: There were no vitals filed for this visit.               Molli Barrows

## 2015-07-06 ENCOUNTER — Ambulatory Visit: Payer: 59 | Attending: Gastroenterology | Admitting: Physical Therapy

## 2015-07-06 DIAGNOSIS — M6281 Muscle weakness (generalized): Secondary | ICD-10-CM | POA: Insufficient documentation

## 2015-07-06 DIAGNOSIS — R279 Unspecified lack of coordination: Secondary | ICD-10-CM | POA: Diagnosis present

## 2015-07-07 NOTE — Therapy (Addendum)
[place] MAIN [facility] SERVICES [address] [place], [place], [address] Phone: [phone]   Fax:  [phone]  Physical Therapy Evaluation  Patient Details  Name: Steven Lucero MRN: [id number] Date of Birth: 06/10/1975 Referring Provider: Jacqulyn Liner  Encounter Date: 07/06/2015      PT End of Session - 07/08/15 1231    Visit Number 1   Number of Visits 12   Date for PT Re-Evaluation 09/28/15   PT Start Time 1603   PT Stop Time 1705   PT Time Calculation (min) 62 min   Activity Tolerance Patient tolerated treatment well;No increased pain   Behavior During Therapy [facility] for tasks assessed/performed      Past Medical History  Diagnosis Date  . Hypertension   . GERD (gastroesophageal reflux disease)   . Anxiety   . Sleep apnea   . IBS (irritable bowel syndrome)   . Depression   . Hyperlipidemia     Past Surgical History  Procedure Laterality Date  . Hernia repair    . Colonoscopy with esophagogastroduodenoscopy (egd)    . Esophagogastroduodenoscopy (egd) with propofol N/A 11/08/2014    Procedure: ESOPHAGOGASTRODUODENOSCOPY (EGD) WITH PROPOFOL;  Surgeon: Lollie Sails, MD;  Location: [facility] ENDOSCOPY;  Service: Endoscopy;  Laterality: N/A;  . Colonoscopy with propofol N/A 06/28/2015    Procedure: COLONOSCOPY WITH PROPOFOL;  Surgeon: Lollie Sails, MD;  Location: [facility] ENDOSCOPY;  Service: Endoscopy;  Laterality: N/A;    There were no vitals filed for this visit.       Subjective Assessment - 07/08/15 1229    Subjective  Pt reports his constipation is mainly brought on stress and anxiety. Pt has had this issue since childhood but he experiences stress at work and home. Pt does not take laxatives.Pt also reports incomplete emptying 90-100% of the time.  Pt 's bowel movements occurs daily to every 3 days with Stool type [facility] Scale) from 4-7. Pt also reports he is taking a medication that has constipation as a side effect. Pt also  states he has had he has swelling pain 7/10  in the rectal area.  He thinks it is due to his weight and increased sitting at his job.   Pt 's GI MD informed him that redness and vascular  issues and and to start using A & D ointment.  There is also associated low back pain associated with constipation.   Pertinent History hemorroid banding, umbilical hernia repair    Patient Stated Goals to return to physical fitness (used to play softball, golf).  Has a 38 yo dtr who enjoys soccer playing. 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                             [facility] PT Assessment - 07/08/15 1230    Assessment   Medical Diagnosis constipation   Referring Provider [place]   Precautions   Precautions None   Restrictions   Weight Bearing Restrictions No   Balance Screen   Has the patient fallen in the past 6 months No   Observation/Other Assessments   Observations sacral sitting , ankles under chair    Other Surveys  --  COREFO 15%, Visceral Sensitivity Index 76%   Coordination   Gross Motor Movements are Fluid and Coordinated --  diaphragmatic excursion,pelvic floor movement noted after Tx   Fine Motor Movements are Fluid and Coordinated --  abdominal  straining for cue for bowel movement   Posture/Postural Control   Posture Comments lumbopelvic perturbations with hip flexion   Palpation   Palpation comment to assess SIJ next session (Hx of falling onto tailbone)   Bed Mobility   Bed Mobility --  log rolling                   [facility] Adult PT Treatment/Exercise - 07/08/15 1230    Therapeutic Activites    Therapeutic Activities --  see pt instructions   Neuro Re-ed    Neuro Re-ed Details  see pt instructions                PT Education - 07/07/15 2331    Education provided Yes   Education Details POC, anatomy, physiology, goals   Person(s) Educated Patient   Methods Explanation;Demonstration;Tactile cues;Verbal cues;Handout   Comprehension  Returned demonstration;Verbalized understanding             PT Long Term Goals - 07/08/15 1231    PT LONG TERM GOAL #1   Title Pt will decrease straining with bowel movements from 40-50% to 0% of the time in order to decrease swelling and hemorrhoids.   Time 12   Period Weeks   Status New   PT LONG TERM GOAL #2   Title Pt will report a decrease in score on ZUNG Anxiety    PT LONG TERM GOAL #3   Title Pt will report walking 15 mins per day and taking stretch breaks 2x/ day at work in orde rto inmprove circulation to the rectal area and to begin losing weight.    Time 12   Period Weeks   Status New   PT LONG TERM GOAL #4   Title Pt report having bowel movements every other day with Stool Type 3-4 75% of the time in order to improve bowel function and minimize cramps and LBP.    Time 12   Period Weeks   Status New   PT LONG TERM GOAL #5   Title Pt will decrease his COREFO score from 15% to < 10% in order to demo improved bowel function   Time 12   Period Weeks   Status New   PT LONG TERM GOAL #6   Title Pt will decrease his ZUNG anxiety score and Viseral Sensivity Index from   to     in order to dmeo IND with stress management.   Time 12   Period Weeks   Status New               Plan - 07/08/15 1241    Clinical Impression Statement Pt is a 40 yo male who c/o constipation with associated rectal pain,  LBP, and stress/anxiety. These deficits impact his QOL and GI function. Pt 's clinical presentations include lack of stress management strategies, weak deep core coordination and strength, and poor sitting posture, sit to stand t/f, and toileting mechanics that place him at risk for abdominal and pelvic floor strain. Further assessment will be performed at next visit to assess rectum and SIJ due to pt's report of swelling around the anus and Hx of a fall onto his tailbone. Following Tx today, pt demo'd proper desk sitting posture, sit to stand without breathholding,  and  toileting without straining and breathing mechanics. Pt voiced understanding on how not to bear downward forces onto his pelvic floor. Pt was also educated on how to use breathing training for relaxation. Pt reported feeling better  at the end of the session.     Patient will benefit from skilled therapeutic intervention in order to improve the following deficits and impairments:  Pain, Improper body mechanics, Postural dysfunction, Increased muscle spasms, Decreased mobility, Decreased coordination, Decreased endurance, Decreased activity tolerance, Obesity, Decreased strength, Decreased range of motion     Rehab Potential Good   Clinical Impairments Affecting Rehab Potential  sedentary desk job, Hx of fall onto tailbone, Hx of constipation as a child, excessive straining, hemorroid banding surgery, umbilical hernia repair, obesity, stress and anxiety    PT Frequency 1x / week   PT Duration 12 weeks   PT Treatment/Interventions Electrical Stimulation;Cryotherapy;ADLs/Self Care Home Management;Moist Heat;Traction;Neuromuscular re-education;Gait training;Therapeutic exercise;Balance training;Therapeutic activities;Functional mobility training;Stair training;Patient/family education;Scar mobilization;Passive range of motion;Manual techniques;Manual lymph drainage;Taping;Energy conservation   Consulted and Agree with Plan of Care Patient      Patient will benefit from skilled therapeutic intervention in order to improve the following deficits and impairments:  Pain, Improper body mechanics, Postural dysfunction, Increased muscle spasms, Decreased mobility, Decreased coordination, Decreased endurance, Decreased activity tolerance, Obesity, Decreased strength, Decreased range of motion  Visit Diagnosis: Muscle weakness (generalized) - Plan: PT plan of care cert/re-cert  Unspecified lack of coordination - Plan: PT plan of care cert/re-cert     Problem List Patient Active Problem List   Diagnosis  Date Noted  . Anxiety 10/02/2013  . Benign essential HTN 10/02/2013  . 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 Obstructive apnea 10/02/2013    Jerl Mina ,PT, DPT, E-RYT  07/08/2015, 12:54 PM  [place] MAIN [facility] SERVICES [address] [place], [place], [address] Phone: [phone]   Fax:  [phone]  Name: Steven Lucero MRN: [id number] Date of Birth: 1975-06-01

## 2015-07-07 NOTE — Patient Instructions (Signed)
You are now ready to begin training the deep core muscles system: diaphragm, transverse abdominis, pelvic floor . These muscles must work together as a team.         The key to these exercises to train the brain to coordinate the timing of these muscles and to have them turn on for long periods of time to hold you upright against gravity (especially important if you are on your feet all day).These muscles are postural muscles and play a role stabilizing your spine and bodyweight. By doing these repetitions slowly and correctly instead of doing crunches, you will achieve a flatter belly without a lower pooch. You are also placing your spine in a more neutral position and breathing properly which in turn, decreases your risk for problems related to your pelvic floor, abdominal, and low back such as pelvic organ prolapse, hernias, diastasis recti (separation of superficial muscles), disk herniations, spinal fractures. These exercises set a solid foundation for you to later progress to resistance/ strength training with therabands and weights and return to other typical fitness exercises with a stronger deeper core.                                                   Preserve the function of your pelvic floor, abdomen, and back.              Avoid decreased straining of abdominal/pelvic floor muscles with less              slouching,  holding your breath with lifting/bowel movements)                                                     FUNCTIONAL POSTURES          

## 2015-07-08 NOTE — Addendum Note (Signed)
Addended by: Jerl Mina on: 07/08/2015 12:54 PM   Modules accepted: Orders

## 2015-07-12 ENCOUNTER — Ambulatory Visit: Payer: 59 | Admitting: Physical Therapy

## 2015-07-12 DIAGNOSIS — M6281 Muscle weakness (generalized): Secondary | ICD-10-CM

## 2015-07-12 DIAGNOSIS — R279 Unspecified lack of coordination: Secondary | ICD-10-CM

## 2015-07-12 NOTE — Patient Instructions (Signed)
Work:  Education officer, environmental modification with towels  6 directions of movement for neck. Spine (3 x each)  At a standing break every hour    Singing at choir: Unlock knees, breathe from diaphragm , relax neck and shoulders    Home:  Open book (handout)  15 reps in morning and evening, both sides  Pelvic tilts with breathing (deep core level 1 ) laying on side.

## 2015-07-13 NOTE — Therapy (Signed)
[place] MAIN [facility] SERVICES [address] [place], [place], [address] Phone: [phone]   Fax:  [phone]  Physical Therapy Treatment  Patient Details  Name: Steven Lucero MRN: [id number] Date of Birth: 1975/05/15 Referring Provider: Jacqulyn Liner  Encounter Date: 07/12/2015      PT End of Session - 07/13/15 2101    Visit Number 2   Number of Visits 12   Date for PT Re-Evaluation 09/28/15   PT Start Time [id number]   PT Stop Time 1720   PT Time Calculation (min) 63 min   Activity Tolerance Patient tolerated treatment well;No increased pain   Behavior During Therapy [facility] for tasks assessed/performed      Past Medical History  Diagnosis Date  . Hypertension   . GERD (gastroesophageal reflux disease)   . Anxiety   . Sleep apnea   . IBS (irritable bowel syndrome)   . Depression   . Hyperlipidemia     Past Surgical History  Procedure Laterality Date  . Hernia repair    . Colonoscopy with esophagogastroduodenoscopy (egd)    . Esophagogastroduodenoscopy (egd) with propofol N/A 11/08/2014    Procedure: ESOPHAGOGASTRODUODENOSCOPY (EGD) WITH PROPOFOL;  Surgeon: Lollie Sails, MD;  Location: [facility] ENDOSCOPY;  Service: Endoscopy;  Laterality: N/A;  . Colonoscopy with propofol N/A 06/28/2015    Procedure: COLONOSCOPY WITH PROPOFOL;  Surgeon: Lollie Sails, MD;  Location: [facility] ENDOSCOPY;  Service: Endoscopy;  Laterality: N/A;    There were no vitals filed for this visit.      Subjective Assessment - 07/13/15 2056    Subjective Pt reported had two days with  long and big bowel movements after last session. Pt has not gotten a step stool for his feet for toileting. Pt stated pt noticed his bowel movements has resorted back to thin bowel movements across the past two days. Pt also had a twitch / mm spasm in the buttock (L) with sitting at work one day and also this morning pt noticed constant bearable pain (5/10)  in between his shoulder blades  with cervical flexion. As he moved  around , pt noticed the pain went away.  Sometimes, he says his throat is not open as normal , for example, when he tried to swallow his pill the other morning. Pt was asked if he sings, and he stated he sings with a church choir 3x /week. Pt has been singing since he has been 74-70 years old.     Pertinent History hemorroid banding, umbilical hernia repair    Patient Stated Goals to return to physical fitness (used to play softball, golf).  Has a 38 yo dtr who enjoys soccer playing. 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                             [facility] PT Assessment - 07/13/15 2100    Observation/Other Assessments   Observations cued for pelvic dissassociation with trunk rotation   Other:   Other/ Comments singing with chest breathing, shorter length of voice.  with cue for diaphragmatic breathing, pt's voices was more voluminous and pt reported less constriction about neck and shoulders   Palpation   Spinal mobility increased hypomobility along thoracic segments                   Pelvic Floor Special Questions - 07/13/15 2056    External Perineal Exam pt consented verbally  without contraindications   Skin Integrity --  slight irritation around 3-4 inches from anal sphincter   External Palpation no mm tensions, tightness, tenderness at PSIS with posterior tilt of pelvis, decreased with anterior tilt cues           [facility] Adult PT Treatment/Exercise - 07/13/15 2100    Therapeutic Activites    Therapeutic Activities --  see pt instructions   Neuro Re-ed    Neuro Re-ed Details  see pt instructions   Manual Therapy   Manual therapy comments PA mobs Grade III along thoracic segments T5-12                PT Education - 07/13/15 2100    Education provided Yes   Education Details HEP   Person(s) Educated Patient   Methods Explanation;Demonstration;Verbal cues;Tactile cues;Handout   Comprehension Returned  demonstration;Verbalized understanding             PT Long Term Goals - 07/08/15 1231    PT LONG TERM GOAL #1   Title Pt will decrease straining with bowel movements from 40-50% to 0% of the time in order to decrease swelling and hemorrhoids.   Time 12   Period Weeks   Status New   PT LONG TERM GOAL #2   Title Pt will report a decrease in score on ZUNG Anxiety    PT LONG TERM GOAL #3   Title Pt will report walking 15 mins per day and taking stretch breaks 2x/ day at work in orde rto inmprove circulation to the rectal area and to begin losing weight.    Time 12   Period Weeks   Status New   PT LONG TERM GOAL #4   Title Pt report having bowel movements every other day with Stool Type 3-4 75% of the time in order to improve bowel function and minimize cramps and LBP.    Time 12   Period Weeks   Status New   PT LONG TERM GOAL #5   Title Pt will decrease his COREFO score from 15% to < 10% in order to demo improved bowel function   Time 12   Period Weeks   Status New   PT LONG TERM GOAL #6   Title Pt will decrease his ZUNG anxiety score and Viseral Sensivity Index from   to     in order to dmeo IND with stress management.   Time 12   Period Weeks   Status New               Plan - 07/12/15 1714    Clinical Impression Statement Pt reported he was able to have 2 good bowel movements following last session.  Today, his midback pain decreased from 5/10 to 2/10 post Tx.  Pt did not show increased pelvic floor mm tensions but did show dyscoordination of respiratory and pelvic floor diaphragms. Pt was able to demo increased diaphragmatic breathing, proper pelvic floor activation and  standing posture while singing. Suspect pt's chest breathing and poor singing mechanics contribute to increased neck and shoulder tensions that he c/o. Pt also learned to find pelvic neutral to minimize spinal pain in sitting posture. Pt continues to show compliance to PT and will benefit from skilled  PT.      Rehab Potential Good   Clinical Impairments Affecting Rehab Potential  sedentary desk job, Hx of fall onto tailbone, Hx of constipation as a child, excessive straining, hemorroid banding surgery, umbilical hernia repair, obesity, stress and  anxiety    PT Frequency 1x / week   PT Duration 12 weeks   PT Treatment/Interventions Electrical Stimulation;Cryotherapy;ADLs/Self Care Home Management;Moist Heat;Traction;Neuromuscular re-education;Gait training;Therapeutic exercise;Balance training;Therapeutic activities;Functional mobility training;Stair training;Patient/family education;Scar mobilization;Passive range of motion;Manual techniques;Manual lymph drainage;Taping;Energy conservation   Consulted and Agree with Plan of Care Patient      Patient will benefit from skilled therapeutic intervention in order to improve the following deficits and impairments:  Pain, Improper body mechanics, Postural dysfunction, Increased muscle spasms, Decreased mobility, Decreased coordination, Decreased endurance, Decreased activity tolerance, Obesity, Decreased strength, Decreased range of motion  Visit Diagnosis: Muscle weakness (generalized)  Unspecified lack of coordination     Problem List Patient Active Problem List   Diagnosis Date Noted  . Anxiety 10/02/2013  . Benign essential HTN 10/02/2013  . 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 Obstructive apnea 10/02/2013    Jerl Mina ,PT, DPT, E-RYT  07/13/2015, 9:06 PM  [place] MAIN [facility] SERVICES [address] [place], [place], [address] Phone: [phone]   Fax:  [phone]  Name: MACAULEY POLISHCHUK MRN: [id number] Date of Birth: 10-12-1975

## 2015-07-14 ENCOUNTER — Encounter: Payer: 59 | Admitting: Physical Therapy

## 2015-07-19 ENCOUNTER — Encounter: Payer: 59 | Admitting: Physical Therapy

## 2015-07-29 ENCOUNTER — Ambulatory Visit: Payer: 59 | Admitting: Physical Therapy

## 2015-08-24 ENCOUNTER — Ambulatory Visit: Payer: 59 | Attending: Gastroenterology | Admitting: Physical Therapy

## 2015-08-24 DIAGNOSIS — R279 Unspecified lack of coordination: Secondary | ICD-10-CM | POA: Insufficient documentation

## 2015-08-24 DIAGNOSIS — M6281 Muscle weakness (generalized): Secondary | ICD-10-CM

## 2015-08-24 NOTE — Patient Instructions (Addendum)
Stretches for spine with a strap or towel  3 slow breaths x 3 reps    1) thumbs out, strap behind behind, knee unlocks, bend back not at low back but the mid back   2) Drying off the back position  Strap is at a diagonal  Anchor is in the upper hand  Pulling down with the other hand by pocket, Turn head to the armpit of upper hand   3) seat belt, ancher is in the hand like you are holding a book back Pulling hand is by the rib  Sense a pulling down of upper trap   4) open book  _________  Sleeping on back or as a starting position for exercises on your back   Bridge up and then lower ribcage first then the pelvis last to elongate the spine Try a pillow under knees to relief back pain   __________  Clam shells for strengthen the hips  Clam Shell 45 Degrees   Lying with hips and knees bent 45, one pillow between knees and ankles. Lift knee with exhale. Be sure pelvis does not roll backward. Do not arch back. Do 10 x 2  times, each leg, 2 times per day.  http://ss.exer.us/75   Copyright  VHI. All rights reserved.

## 2015-08-24 NOTE — Therapy (Signed)
[place] MAIN [facility] SERVICES [address] [place], [place], [address] Phone: [phone]   Fax:  [phone]  Physical Therapy Treatment  Patient Details  Name: Steven Lucero MRN: [id number] Date of Birth: [birth year] Referring Provider: Jacqulyn Liner  Encounter Date: 08/24/2015      PT End of Session - 08/24/15 1703    Visit Number 3   Number of Visits 12   Date for PT Re-Evaluation 09/28/15   PT Start Time 1610   PT Stop Time 1710   PT Time Calculation (min) 60 min   Activity Tolerance Patient tolerated treatment well;No increased pain   Behavior During Therapy WFL for tasks assessed/performed      Past Medical History:  Diagnosis Date  . Anxiety   . Depression   . GERD (gastroesophageal reflux disease)   . Hyperlipidemia   . Hypertension   . IBS (irritable bowel syndrome)   . Sleep apnea     Past Surgical History:  Procedure Laterality Date  . COLONOSCOPY WITH ESOPHAGOGASTRODUODENOSCOPY (EGD)    . COLONOSCOPY WITH PROPOFOL N/A 06/28/2015   Procedure: COLONOSCOPY WITH PROPOFOL;  Surgeon: Lollie Sails, MD;  Location: [facility] ENDOSCOPY;  Service: Endoscopy;  Laterality: N/A;  . ESOPHAGOGASTRODUODENOSCOPY (EGD) WITH PROPOFOL N/A 11/08/2014   Procedure: ESOPHAGOGASTRODUODENOSCOPY (EGD) WITH PROPOFOL;  Surgeon: Lollie Sails, MD;  Location: [facility] ENDOSCOPY;  Service: Endoscopy;  Laterality: N/A;  . HERNIA REPAIR      There were no vitals filed for this visit.      Subjective Assessment - 08/24/15 1616    Subjective Pt reported since last session a month ago, pt has been performing his stretches which he says has been helping his buttock spasms. Pt has been correcting his seated posture.  Pt's mm spasms and constipation improved by 60-70% for two weeks following last session.  For the past two weeks, pt has experienced mm spasms and constipation and stomach cramps after he started taking Carafate. He syas these Sx are side-effects  of the medication. Pt acknowledge if he drinks more water and getting up moving around, pt thinks these side effects could be less. Pt also noticed lower back pain is 6/10 today which he has had in the past. This low back pain eases with bowel movements. Pain is worse with extension of the spine.      Pertinent History hemorroid banding, umbilical hernia repair    Patient Stated Goals to return to physical fitness (used to play softball, golf).  Has a 38 yo dtr who enjoys soccer playing. 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                             [facility] PT Assessment - 08/24/15 1727      AROM   Overall AROM Comments LBP pain with ext (knees locked), no spinal pain with cue for unlocked knees and thoracic ext instead of lumbar ext     Palpation   Palpation comment increased mm tensions along L3-4, PA mobs triggered radiating pain. down R thigh                     [facility] Adult PT Treatment/Exercise - 08/24/15 1728      Neuro Re-ed    Neuro Re-ed Details  see pt instructions     Manual Therapy   Manual therapy comments PA mobs with rotation in L sidelying at  L3-4 10 reps of Grade III                 PT Education - 08/24/15 1724    Education provided Yes   Education Details HEP   Person(s) Educated Patient   Methods Explanation;Demonstration;Tactile cues;Verbal cues;Handout   Comprehension Returned demonstration;Verbalized understanding             PT Long Term Goals - 08/24/15 1731      PT LONG TERM GOAL #1   Title Pt will decrease straining with bowel movements from 40-50% to 0% of the time in order to decrease swelling and hemorrhoids.   Time 12   Period Weeks   Status Partially Met     PT LONG TERM GOAL #2   Title Pt will demo proper alignment and technique with fitness routine at the gym in order to minimize risk of injuries   Time 12   Period Weeks   Status New     PT LONG TERM GOAL #3   Title Pt will report  walking 15 mins per day and taking stretch breaks 2x/ day at work in orde rto inmprove circulation to the rectal area and to begin losing weight.    Time 12   Period Weeks   Status On-going     PT LONG TERM GOAL #4   Title Pt report having bowel movements every other day with Stool Type 3-4 75% of the time in order to improve bowel function and minimize cramps and LBP.    Time 12   Period Weeks   Status On-going     PT LONG TERM GOAL #5   Title Pt will decrease his COREFO score from 15% to < 10% in order to demo improved bowel function   Time 12   Period Weeks   Status On-going     PT LONG TERM GOAL #6   Title Pt will decrease his ZUNG anxiety score and Viseral Sensivity Index from   to     in order to demo IND with stress management.   Period Weeks   Status On-going     PT LONG TERM GOAL #7   Title Pt will be IND with HEP that will be complementary to golfing in order to minimize risk of injuries and to help pt return to his hobby without Sx   Time 12   Period Weeks   Status New               Plan - 08/24/15 1724    Clinical Impression Statement Pt had a relapse of his constipation , mm spasm, and low back Sx 2/2 to the side effects of the new medication he is taking. Pt had two weeks of successful self-management prior to this period. Today pt's LBP was resolved with manual Tx.  Reviewed stretches and added new stretches with postural education to minimze LBP and mm tensions. Discussed strategies to help with increased water intake and maintaining movement routine. Pt voiced understanding. Pt continues to require skilled PT to advance towards his goals. Plan to progress pt towards deep core strengthening at next session.    Rehab Potential Good   Clinical Impairments Affecting Rehab Potential  sedentary desk job, Hx of fall onto tailbone, Hx of constipation as a child, excessive straining, hemorroid banding surgery, umbilical hernia repair, obesity, stress and anxiety    PT  Frequency 1x / week   PT Duration 12 weeks   PT Treatment/Interventions Electrical Stimulation;Cryotherapy;ADLs/Self Care Home  Management;Moist Heat;Traction;Neuromuscular re-education;Gait training;Therapeutic exercise;Balance training;Therapeutic activities;Functional mobility training;Stair training;Patient/family education;Scar mobilization;Passive range of motion;Manual techniques;Manual lymph drainage;Taping;Energy conservation   Consulted and Agree with Plan of Care Patient      Patient will benefit from skilled therapeutic intervention in order to improve the following deficits and impairments:  Pain, Improper body mechanics, Postural dysfunction, Increased muscle spasms, Decreased mobility, Decreased coordination, Decreased endurance, Decreased activity tolerance, Obesity, Decreased strength, Decreased range of motion  Visit Diagnosis: Muscle weakness (generalized)  Unspecified lack of coordination     Problem List Patient Active Problem List   Diagnosis Date Noted  . Anxiety 10/02/2013  . Benign essential HTN 10/02/2013  . 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 Obstructive apnea 10/02/2013    Steven Lucero  ,PT, DPT, E-RYT  08/24/2015, 5:33 PM  [place] MAIN [facility] SERVICES [address] [place], [place], [address] Phone: [phone]   Fax:  [phone]  Name: Steven Lucero MRN: [id number] Date of Birth: 25-Sep-1975

## 2015-08-30 ENCOUNTER — Encounter: Payer: 59 | Admitting: Physical Therapy

## 2015-09-06 ENCOUNTER — Ambulatory Visit: Payer: 59 | Admitting: Physical Therapy

## 2015-09-06 ENCOUNTER — Encounter: Payer: Self-pay | Admitting: Physical Therapy

## 2015-09-20 ENCOUNTER — Ambulatory Visit: Payer: 59 | Admitting: Physical Therapy

## 2015-09-20 DIAGNOSIS — M6281 Muscle weakness (generalized): Secondary | ICD-10-CM | POA: Diagnosis not present

## 2015-09-20 DIAGNOSIS — R279 Unspecified lack of coordination: Secondary | ICD-10-CM

## 2015-09-21 NOTE — Patient Instructions (Signed)
Making healthier food choices: Start with what you select to go into shopping cart, decrease boxes of processed foods (sweets)  Pack small tupperware of nuts, dried fruits as snacks instead of going for processed snacks Keep track of consumption of ice cream/ candy on the chart provided and ration it out across the week (decrease to 3x/ week instead of daily)    Book Heal Pain Now by De Blanch, PT    Exercises: Blue band:   10 x reps x 2  / day  " pulling down seatbelt"   " starting the lawnmover"   "multifidis twist"

## 2015-09-21 NOTE — Therapy (Signed)
[place] MAIN [facility] SERVICES [address] [place], [place], [address] Phone: [phone]   Fax:  [phone]  Physical Therapy Treatment / Progress Note  Patient Details  Name: Steven Lucero MRN: [id number] Date of Birth: June 29, 1975 Referring Provider: Jacqulyn Liner  Encounter Date: 09/20/2015      PT End of Session - 09/21/15 2145    Visit Number 4   Number of Visits 12   Date for PT Re-Evaluation 09/28/15   PT Start Time 1700   PT Stop Time 1750   PT Time Calculation (min) 50 min   Activity Tolerance Patient tolerated treatment well;No increased pain   Behavior During Therapy WFL for tasks assessed/performed      Past Medical History:  Diagnosis Date  . Anxiety   . Depression   . GERD (gastroesophageal reflux disease)   . Hyperlipidemia   . Hypertension   . IBS (irritable bowel syndrome)   . Sleep apnea     Past Surgical History:  Procedure Laterality Date  . COLONOSCOPY WITH ESOPHAGOGASTRODUODENOSCOPY (EGD)    . COLONOSCOPY WITH PROPOFOL N/A 06/28/2015   Procedure: COLONOSCOPY WITH PROPOFOL;  Surgeon: Lollie Sails, MD;  Location: [facility] ENDOSCOPY;  Service: Endoscopy;  Laterality: N/A;  . ESOPHAGOGASTRODUODENOSCOPY (EGD) WITH PROPOFOL N/A 11/08/2014   Procedure: ESOPHAGOGASTRODUODENOSCOPY (EGD) WITH PROPOFOL;  Surgeon: Lollie Sails, MD;  Location: [facility] ENDOSCOPY;  Service: Endoscopy;  Laterality: N/A;  . HERNIA REPAIR      There were no vitals filed for this visit.      Subjective Assessment - 09/20/15 1704    Subjective Pt has been working improving his constipation which has also improved his LBP. Rectal mm spasms have continue to improve for the past month. The open book exercise has been helping him.    Pertinent History hemorroid banding, umbilical hernia repair    Patient Stated Goals to return to physical fitness (used to play softball, golf).  Has a 38 yo dtr who enjoys soccer playing. 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                             [facility] PT Assessment - 09/21/15 2139      Observation/Other Assessments   Observations good carry over with proper standing posture and deep core activation with new HEP                     [facility] Adult PT Treatment/Exercise - 09/21/15 2140      Therapeutic Activites    Therapeutic Activities --  see pt instructions     Neuro Re-ed    Neuro Re-ed Details  see pt instructions                PT Education - 09/21/15 2143    Education provided Yes   Education Details HEP, motivational interviewing and goal setting on making lifestyle choices for healthy foods and incorporating exercises into day, POC on progressing with resistive bands and eventually gym machines    Person(s) Educated Patient   Methods Explanation;Demonstration;Tactile cues;Verbal cues;Handout   Comprehension Returned demonstration;Verbalized understanding             PT Long Term Goals - 09/21/15 2145      PT LONG TERM GOAL #1   Title Pt will decrease straining with bowel movements from 40-50% to 0% of the time in order to decrease swelling  and hemorrhoids.   Time 12   Period Weeks   Status Partially Met     PT LONG TERM GOAL #2   Title Pt will demo proper alignment and technique with fitness routine at the gym in order to minimize risk of injuries   Time 12   Period Weeks   Status On-going     PT LONG TERM GOAL #3   Title Pt will report walking 15 mins per day and taking stretch breaks 2x/ day at work in orde rto inmprove circulation to the rectal area and to begin losing weight.    Time 12   Period Weeks   Status Achieved     PT LONG TERM GOAL #4   Title Pt report having bowel movements every other day with Stool Type 3-4 75% of the time in order to improve bowel function and minimize cramps and LBP.    Time 12   Period Weeks   Status Achieved     PT LONG TERM GOAL #5   Title Pt will decrease his COREFO  score from 15% to < 10% in order to demo improved bowel function   Time 12   Period Weeks   Status Partially Met     PT LONG TERM GOAL #6   Title Pt will decrease his ZUNG anxiety score and Viseral Sensivity Index from   to     in order to demo IND with stress management.   Period Weeks   Status Partially Met     PT LONG TERM GOAL #7   Title Pt will be IND with HEP that will be complementary to golfing in order to minimize risk of injuries and to help pt return to his hobby without Sx   Time 12   Period Weeks   Status Partially Met               Plan - 09/21/15 2146    Clinical Impression Statement Pt has achieved 2 goals and partially his remaining 5 goals. Pt reports his constipation, rectal spasms,and LBP have improved and he demonstrates proper coordination of his pelvic floor mm.  Pt is progressing towards the strengthening phase of his rehab in order to be ready to return to the gym to achieve his goal to lose weight.  Pt was educated on proper alignment and deep core mm activation to minimize injuries and he demo'd technique correctly. Pt required motivational interviewing  to help him engage in creating achievable goals towards healthier lifetsyle changes. Pt expressed willingness to follow recommendations and suggestions. Pt will continue benefit from skilled PT.      Rehab Potential Good   Clinical Impairments Affecting Rehab Potential  sedentary desk job, Hx of fall onto tailbone, Hx of constipation as a child, excessive straining, hemorroid banding surgery, umbilical hernia repair, obesity, stress and anxiety    PT Frequency 1x / week   PT Duration 12 weeks   PT Treatment/Interventions Electrical Stimulation;Cryotherapy;ADLs/Self Care Home Management;Moist Heat;Traction;Neuromuscular re-education;Gait training;Therapeutic exercise;Balance training;Therapeutic activities;Functional mobility training;Stair training;Patient/family education;Scar mobilization;Passive range of  motion;Manual techniques;Manual lymph drainage;Taping;Energy conservation   Consulted and Agree with Plan of Care Patient      Patient will benefit from skilled therapeutic intervention in order to improve the following deficits and impairments:  Pain, Improper body mechanics, Postural dysfunction, Increased muscle spasms, Decreased mobility, Decreased coordination, Decreased endurance, Decreased activity tolerance, Obesity, Decreased strength, Decreased range of motion  Visit Diagnosis: Muscle weakness (generalized)  Unspecified lack of coordination  Problem List Patient Active Problem List   Diagnosis Date Noted  . Anxiety 10/02/2013  . Benign essential HTN 10/02/2013  . 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 Obstructive apnea 10/02/2013    Jerl Mina ,PT, DPT, E-RYT  09/21/2015, 10:01 PM  [place] MAIN [facility] SERVICES [address] [place], [place], [address] Phone: [phone]   Fax:  [phone]  Name: HODGES TREIBER MRN: [id number] Date of Birth: June 25, 1975

## 2015-10-04 ENCOUNTER — Encounter: Payer: 59 | Admitting: Physical Therapy

## 2015-10-06 ENCOUNTER — Ambulatory Visit: Payer: 59 | Admitting: Physical Therapy

## 2015-10-06 ENCOUNTER — Encounter: Payer: Self-pay | Admitting: Physical Therapy

## 2015-10-13 ENCOUNTER — Ambulatory Visit (INDEPENDENT_AMBULATORY_CARE_PROVIDER_SITE_OTHER): Payer: 59 | Admitting: Psychiatry

## 2015-10-13 ENCOUNTER — Encounter: Payer: Self-pay | Admitting: Psychiatry

## 2015-10-13 VITALS — BP 130/86 | HR 79 | Ht 75.0 in | Wt >= 6400 oz

## 2015-10-13 DIAGNOSIS — F4323 Adjustment disorder with mixed anxiety and depressed mood: Secondary | ICD-10-CM | POA: Diagnosis not present

## 2015-10-13 MED ORDER — HYDROXYZINE HCL 50 MG PO TABS: 50.0000 mg | ORAL_TABLET | Freq: Two times a day (BID) | ORAL | 0 refills | Status: DC | PRN

## 2015-10-13 MED ORDER — ESCITALOPRAM OXALATE 10 MG PO TABS: 20.0000 mg | ORAL_TABLET | Freq: Every day | ORAL | 1 refills | Status: DC

## 2015-10-13 MED ORDER — ESCITALOPRAM OXALATE 20 MG PO TABS: 20.0000 mg | ORAL_TABLET | Freq: Every day | ORAL | 0 refills | Status: AC

## 2015-10-13 NOTE — Progress Notes (Signed)
Psychiatric MD Progress Note   Patient Identification: Steven Lucero MRN:  [id number] Date of Evaluation:  10/13/2015 Referral Source: Elmyra Ricks- Therapist Chief Complaint:    Visit Diagnosis:    ICD-9-CM ICD-10-CM   1. Adjustment disorder with mixed anxiety and depressed mood 309.28 F43.23    Diagnosis:   Patient Active Problem List   Diagnosis Date Noted  . Anxiety [F41.9] 10/02/2013  . Benign essential HTN [I10] 10/02/2013  . 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 Obstructive apnea [G47.33] 10/02/2013   History of Present Illness:  Patient is a 40 year old morbidly obese male who Presented for the follow-up appointment. He was last seen in January. He reported that he wants to have his medications adjusted as he was started on Lexapro 20 mg by his primary care physician. He reported that he continues to feel anxious and has increased in his anxiety related to his IBS symptoms. He reported that he feels more anxious because of his GI symptoms at this time. He was looking for a when necessary medication although he is well aware that the anxiety is related to his IBS symptoms and when the symptoms will begin to control his anxiety will improve. He reported that he is not taking the medications which were prescribed at the last appointment.  He was focused ongetting a prescription for Xanax at this time. We discussed about starting him back on the hydroxyzine as he was taking at the previous appointment. He agreed with the plan. He currently denied having any suicidal homicidal ideations or plans.    Elements:  Severity:  mild. Associated Signs/Symptoms: Depression Symptoms:  fatigue, anxiety, panic attacks, (Hypo) Manic Symptoms:  Flight of Ideas, Anxiety Symptoms:  Excessive Worry, Panic Symptoms, Psychotic Symptoms:  none PTSD Symptoms: Negative NA  Past Medical History:  Past Medical History:  Diagnosis Date  . Anxiety   . Depression   . GERD (gastroesophageal reflux  disease)   . Hyperlipidemia   . Hypertension   . IBS (irritable bowel syndrome)   . Sleep apnea     Past Surgical History:  Procedure Laterality Date  . COLONOSCOPY WITH ESOPHAGOGASTRODUODENOSCOPY (EGD)    . COLONOSCOPY WITH PROPOFOL N/A 06/28/2015   Procedure: COLONOSCOPY WITH PROPOFOL;  Surgeon: Lollie Sails, MD;  Location: [facility] ENDOSCOPY;  Service: Endoscopy;  Laterality: N/A;  . ESOPHAGOGASTRODUODENOSCOPY (EGD) WITH PROPOFOL N/A 11/08/2014   Procedure: ESOPHAGOGASTRODUODENOSCOPY (EGD) WITH PROPOFOL;  Surgeon: Lollie Sails, MD;  Location: [facility] ENDOSCOPY;  Service: Endoscopy;  Laterality: N/A;  . HERNIA REPAIR     Family History:  Family History  Problem Relation Age of Onset  . Heart attack Father   . Heart disease Father   . Diabetes Father    Social History:   Social History   Social History  . Marital status: Married    Spouse name: N/A  . Number of children: N/A  . Years of education: N/A   Social History Main Topics  . Smoking status: Current Every Day Smoker    Types: E-cigarettes  . Smokeless tobacco: Current User    Types: Snuff  . Alcohol use No  . Drug use: No  . Sexual activity: Yes    Birth control/ protection: None   Other Topics Concern  . None   Social History Narrative  . 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  Married x 11 years Has a 59 years yo. She works at [employer] as well.  Musculoskeletal: Strength & Muscle Tone: within normal limits Gait & Station: normal Patient leans: N/A  Psychiatric Specialty Exam: HPI  ROS  Blood pressure 130/86, pulse 79, height 6\' 3"  (1.905 m), weight (!) 415 lb 6.4 oz (188.4 kg).Body mass index is 51.92 kg/m.  General Appearance: Casual  Eye Contact:  Fair  Speech:  Clear and Coherent  Volume:  Normal  Mood:  Anxious  Affect:  Congruent  Thought Process:  Coherent  Orientation:  Full (Time, Place, and Person)  Thought Content:   WDL  Suicidal Thoughts:  No  Homicidal Thoughts:  No  Memory:  Immediate;   Fair  Judgement:  Fair  Insight:  Fair  Psychomotor Activity:  Normal  Concentration:  Fair  Recall:  [employer] of Knowledge:Fair  Language: Fair  Akathisia:  No  Handed:  Right  AIMS (if indicated):    Assets:  Communication Skills Desire for Improvement Physical Health Social Support  ADL's:  Intact  Cognition: WNL    Sleep: " Not great "   Is the patient at risk to self?  No. Has the patient been a risk to self in the past 6 months?  No. Has the patient been a risk to self within the distant past?  No. Is the patient a risk to others?  No. Has the patient been a risk to others in the past 6 months?  No. Has the patient been a risk to others within the distant past?  No.  Allergies:   Allergies  Allergen Reactions  . Augmentin [Amoxicillin-Pot Clavulanate]   . Penicillin G Other (See Comments)  . Penicillins    Current Medications: Current Outpatient Prescriptions  Medication Sig Dispense Refill  . amLODipine (NORVASC) 5 MG tablet Take 5 mg by mouth daily.    Marland Kitchen escitalopram (LEXAPRO) 20 MG tablet Take 1 tablet (20 mg total) by mouth at bedtime. Pt is getting from PCP 90 tablet 0  . hydrOXYzine (ATARAX/VISTARIL) 50 MG tablet Take 1 tablet (50 mg total) by mouth 2 (two) times daily as needed. 60 tablet 0  . losartan-hydrochlorothiazide (HYZAAR) 50-12.5 MG tablet Take 1 tablet by mouth daily.    . RABEprazole (ACIPHEX) 20 MG tablet Reported on 06/28/2015  2  . ranitidine (ZANTAC) 150 MG capsule Take 150 mg by mouth 2 (two) times daily.    . sucralfate (CARAFATE) 1 G tablet Take 1 g by mouth 4 (four) times daily -  with meals and at bedtime.     No current facility-administered medications for this visit.     Previous Psychotropic Medications: Buspar, Zoloft, Paroxetine, Lexapro. Xanax.   Substance Abuse History in the last 12 months:  No.  Consequences of Substance  Abuse: Negative NA  Medical Decision Making:  Review of Psycho-Social Stressors (1)  Treatment Plan Summary: Medication management   Discussed with patient at length about the several different treatment options.  Patient reported that he is currently taking Lexapro 20 mg at bedtime. Advised him to titrate to 30 mg and he agreed the plan. He has enough supply of the medication as he gets a 90 day supply at a time.  He will be started on hydroxyzine 50 mg by mouth twice a day when necessary. He agreed with the plan.  Follow-up in one month or earlier depending on his symptoms   More than 50% of the time spent in psychoeducation, counseling and coordination of care.    This note was generated in part or whole with voice  recognition software. Voice regonition is usually quite accurate but there are transcription errors that can and very often do occur. I apologize for any typographical errors that were not detected and corrected.   Rainey Pines, MD    9/21/201712:40 PM

## 2015-10-24 ENCOUNTER — Encounter: Payer: Self-pay | Admitting: Physical Therapy

## 2015-10-25 ENCOUNTER — Ambulatory Visit: Payer: 59 | Admitting: Physical Therapy

## 2015-11-14 ENCOUNTER — Encounter: Payer: 59 | Admitting: Physical Therapy

## 2015-12-12 ENCOUNTER — Encounter: Payer: 59 | Admitting: Physical Therapy

## 2015-12-13 ENCOUNTER — Encounter: Payer: Self-pay | Admitting: Physical Therapy

## 2015-12-13 NOTE — Therapy (Signed)
[place] MAIN [facility] SERVICES [address] [place], [place], [address] Phone: [phone]   Fax:  [phone]   Patient Details    Name: Steven Lucero MRN: [id number] Date of Birth: Dec 14, 1975 Referring Provider: Jacqulyn Liner    Discharge Summary   Pt has completed 3 visits, cancelled 8 appts, and no showed to 2 appts. Pt is being d/c at this time due to non-compliance.    Jerl Mina ,PT, DPT, E-RYT  12/13/2015, 2:24 PM  [place] MAIN [facility] SERVICES [address] [place], [place], [address] Phone: [phone]   Fax:  [phone]

## 2015-12-13 NOTE — Therapy (Signed)
[place] MAIN [facility] SERVICES [address] [place], [place], [address] Phone: [phone]   Fax:  [phone]  Patient Details  Name: Steven Lucero MRN: [id number] Date of Birth: 05-09-1975 Referring Provider:  Jacqulyn Liner  Encounter Date: 12/13/2015  Pt was emailed information about up-to-date  free smoking cessation services at [facility] as PT remembered that pt stated he was working smoking cessation at previous visits.    Jerl Mina ,PT, DPT, E-RYT  12/13/2015, 2:29 PM  [place] MAIN [facility] SERVICES [address] [place], [place], [address] Phone: [phone]   Fax:  [phone]

## 2016-02-06 ENCOUNTER — Telehealth: Payer: Self-pay

## 2016-02-06 NOTE — Telephone Encounter (Signed)
pt called states that he is having some anxiety for a upcoming crusise he will be taking.  he states that the hydroxyzine make him to sleep.  he has appt for 03-08-15 pt was wondering if you can call him in about a 2 week supply of something to help him while on his trip. He just feeling anxiety

## 2016-02-06 NOTE — Telephone Encounter (Signed)
spoke with patient pt was given a 11:15 appt to be seen.  he needs lunch time appt and he needs to be see before trip

## 2016-02-06 NOTE — Telephone Encounter (Signed)
Not seen since Sept. Will not be able to prescribe anything. He needs to make appt for medications.

## 2016-02-07 NOTE — Telephone Encounter (Signed)
pt called states that they are calling for bad weather for tomorrow.  wanted to know if you would call him.  because he really needs something before his trip on friday.

## 2016-02-07 NOTE — Telephone Encounter (Signed)
spoke with patient . advised pt that I sent dr. Gretel Acre a message and that if the office was closed due to weather then he would need to go to urgent care or walk in clinic or er for medications.

## 2016-02-07 NOTE — Telephone Encounter (Signed)
I have not seen him since Sept.I dont know what he is looking for and will not be able to prescribe anything without appt.

## 2016-02-08 ENCOUNTER — Ambulatory Visit: Payer: 59 | Admitting: Psychiatry

## 2016-03-07 ENCOUNTER — Ambulatory Visit: Payer: 59 | Admitting: Psychiatry

## 2016-12-17 ENCOUNTER — Emergency Department
Admission: EM | Admit: 2016-12-17 | Discharge: 2016-12-17 | Disposition: A | Discharge status: Home / Self Care | Payer: 59 | Attending: Emergency Medicine | Admitting: Emergency Medicine

## 2016-12-17 ENCOUNTER — Encounter: Payer: Self-pay | Admitting: Emergency Medicine

## 2016-12-17 DIAGNOSIS — G8929 Other chronic pain: Secondary | ICD-10-CM | POA: Diagnosis not present

## 2016-12-17 DIAGNOSIS — I10 Essential (primary) hypertension: Secondary | ICD-10-CM | POA: Insufficient documentation

## 2016-12-17 DIAGNOSIS — F17290 Nicotine dependence, other tobacco product, uncomplicated: Secondary | ICD-10-CM | POA: Diagnosis not present

## 2016-12-17 DIAGNOSIS — R1030 Lower abdominal pain, unspecified: Secondary | ICD-10-CM | POA: Diagnosis not present

## 2016-12-17 DIAGNOSIS — Z79899 Other long term (current) drug therapy: Secondary | ICD-10-CM | POA: Insufficient documentation

## 2016-12-17 DIAGNOSIS — R109 Unspecified abdominal pain: Secondary | ICD-10-CM

## 2016-12-17 LAB — COMPREHENSIVE METABOLIC PANEL
ALBUMIN: 4.3 g/dL (ref 3.5–5.0)
ALT: 27 U/L (ref 17–63)
ANION GAP: 10 (ref 5–15)
AST: 25 U/L (ref 15–41)
Alkaline Phosphatase: 68 U/L (ref 38–126)
BUN: 15 mg/dL (ref 6–20)
CHLORIDE: 100 mmol/L — AB (ref 101–111)
CO2: 26 mmol/L (ref 22–32)
Calcium: 9.3 mg/dL (ref 8.9–10.3)
Creatinine, Ser: 0.9 mg/dL (ref 0.61–1.24)
GFR calc Af Amer: >60 mL/min (ref >60)
GFR calc non Af Amer: >60 mL/min (ref >60)
GLUCOSE: 105 mg/dL — AB (ref 65–99)
POTASSIUM: 3.5 mmol/L (ref 3.5–5.1)
SODIUM: 136 mmol/L (ref 135–145)
TOTAL PROTEIN: 8.1 g/dL (ref 6.5–8.1)
Total Bilirubin: 0.7 mg/dL (ref 0.3–1.2)

## 2016-12-17 LAB — CBC
HEMATOCRIT: 48.9 % (ref 40.0–52.0)
Hemoglobin: 16.3 g/dL (ref 13.0–18.0)
MCH: 28 pg (ref 26.0–34.0)
MCHC: 33.3 g/dL (ref 32.0–36.0)
MCV: 84 fL (ref 80.0–100.0)
Platelets: 250 10*3/uL (ref 150–440)
RBC: 5.81 MIL/uL (ref 4.40–5.90)
RDW: 14.4 % (ref 11.5–14.5)
WBC: 5.6 10*3/uL (ref 3.8–10.6)

## 2016-12-17 LAB — URINALYSIS, COMPLETE (UACMP) WITH MICROSCOPIC
BACTERIA UA: NONE SEEN
Bilirubin Urine: NEGATIVE
Glucose, UA: NEGATIVE mg/dL
Hgb urine dipstick: NEGATIVE
Ketones, ur: NEGATIVE mg/dL
Leukocytes, UA: NEGATIVE
NITRITE: NEGATIVE
Protein, ur: NEGATIVE mg/dL
RBC / HPF: NONE SEEN RBC/hpf (ref 0–5)
SPECIFIC GRAVITY, URINE: 1.018 (ref 1.005–1.030)
SQUAMOUS EPITHELIAL / LPF: NONE SEEN
pH: 6 (ref 5.0–8.0)

## 2016-12-17 LAB — LIPASE, BLOOD: LIPASE: 24 U/L (ref 11–51)

## 2016-12-17 NOTE — Discharge Instructions (Signed)
Please make an appointment to follow-up with your gastroenterologist within the next week for recheck and return to our emergency department for any new or worsening symptoms such as fevers, chills, worsening pain, if you cannot eat or drink, or for any other concerns whatsoever.  It was a pleasure to take care of you today, and thank you for coming to our emergency department.  If you have any questions or concerns before leaving please ask the nurse to grab me and I'm more than happy to go through your aftercare instructions again.  If you were prescribed any opioid pain medication today such as Norco, Vicodin, Percocet, morphine, hydrocodone, or oxycodone please make sure you do not drive when you are taking this medication as it can alter your ability to drive safely.  If you have any concerns once you are home that you are not improving or are in fact getting worse before you can make it to your follow-up appointment, please do not hesitate to call 911 and come back for further evaluation.  Darel Hong, MD  Results for orders placed or performed during the hospital encounter of 12/17/16  Lipase, blood  Result Value Ref Range   Lipase 24 11 - 51 U/L  Comprehensive metabolic panel  Result Value Ref Range   Sodium 136 135 - 145 mmol/L   Potassium 3.5 3.5 - 5.1 mmol/L   Chloride 100 (L) 101 - 111 mmol/L   CO2 26 22 - 32 mmol/L   Glucose, Bld 105 (H) 65 - 99 mg/dL   BUN 15 6 - 20 mg/dL   Creatinine, Ser 0.90 0.61 - 1.24 mg/dL   Calcium 9.3 8.9 - 10.3 mg/dL   Total Protein 8.1 6.5 - 8.1 g/dL   Albumin 4.3 3.5 - 5.0 g/dL   AST 25 15 - 41 U/L   ALT 27 17 - 63 U/L   Alkaline Phosphatase 68 38 - 126 U/L   Total Bilirubin 0.7 0.3 - 1.2 mg/dL   GFR calc non Af Amer >60 >60 mL/min   GFR calc Af Amer >60 >60 mL/min   Anion gap 10 5 - 15  CBC  Result Value Ref Range   WBC 5.6 3.8 - 10.6 K/uL   RBC 5.81 4.40 - 5.90 MIL/uL   Hemoglobin 16.3 13.0 - 18.0 g/dL   HCT 48.9 40.0 - 52.0 %   MCV 84.0 80.0 - 100.0 fL   MCH 28.0 26.0 - 34.0 pg   MCHC 33.3 32.0 - 36.0 g/dL   RDW 14.4 11.5 - 14.5 %   Platelets 250 150 - 440 K/uL  Urinalysis, Complete w Microscopic  Result Value Ref Range   Color, Urine YELLOW (A) YELLOW   APPearance HAZY (A) CLEAR   Specific Gravity, Urine 1.018 1.005 - 1.030   pH 6.0 5.0 - 8.0   Glucose, UA NEGATIVE NEGATIVE mg/dL   Hgb urine dipstick NEGATIVE NEGATIVE   Bilirubin Urine NEGATIVE NEGATIVE   Ketones, ur NEGATIVE NEGATIVE mg/dL   Protein, ur NEGATIVE NEGATIVE mg/dL   Nitrite NEGATIVE NEGATIVE   Leukocytes, UA NEGATIVE NEGATIVE   RBC / HPF NONE SEEN 0 - 5 RBC/hpf   WBC, UA 0-5 0 - 5 WBC/hpf   Bacteria, UA NONE SEEN NONE SEEN   Squamous Epithelial / LPF NONE SEEN NONE SEEN   Mucus PRESENT

## 2016-12-17 NOTE — ED Provider Notes (Signed)
[facility] Emergency Department Provider Note  ____________________________________________   First MD Initiated Contact with Patient 12/17/16 1907     (approximate)  I have reviewed the triage vital signs and the nursing notes.   HISTORY  Chief Complaint Abdominal Pain    HPI Steven Lucero is a 41 y.o. male who presents to the emergency department with roughly 5 years of abdominal pain.  He has a complex history and is followed at [facility] gastroenterology.  He describes near constant moderate to severe burning discomfort in his abdomen.  Nothing seems to make it better or worse.  He has been diagnosed with small intestinal bacterial overgrowth and is tried 3 different courses of antibiotics.  His pain was slightly worse today but mostly he wanted a second opinion.  He has no fevers or chills.  Past Medical History:  Diagnosis Date  . Anxiety   . Depression   . GERD (gastroesophageal reflux disease)   . Hyperlipidemia   . Hypertension   . IBS (irritable bowel syndrome)   . Sleep apnea     Patient Active Problem List   Diagnosis Date Noted  . Anxiety 10/02/2013  . Benign essential HTN 10/02/2013  . 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 Obstructive apnea 10/02/2013    Past Surgical History:  Procedure Laterality Date  . COLONOSCOPY WITH ESOPHAGOGASTRODUODENOSCOPY (EGD)    . COLONOSCOPY WITH PROPOFOL N/A 06/28/2015   Procedure: COLONOSCOPY WITH PROPOFOL;  Surgeon: Lollie Sails, MD;  Location: [facility] ENDOSCOPY;  Service: Endoscopy;  Laterality: N/A;  . ESOPHAGOGASTRODUODENOSCOPY (EGD) WITH PROPOFOL N/A 11/08/2014   Procedure: ESOPHAGOGASTRODUODENOSCOPY (EGD) WITH PROPOFOL;  Surgeon: Lollie Sails, MD;  Location: [facility] ENDOSCOPY;  Service: Endoscopy;  Laterality: N/A;  . HERNIA REPAIR      Prior to Admission medications   Medication Sig Start Date End Date Taking? Authorizing Provider  amLODipine (NORVASC) 5 MG tablet Take  5 mg by mouth daily.    [provider]  escitalopram (LEXAPRO) 20 MG tablet Take 1 tablet (20 mg total) by mouth at bedtime. Pt is getting from PCP 10/13/15   Rainey Pines, MD  hydrOXYzine (ATARAX/VISTARIL) 50 MG tablet Take 1 tablet (50 mg total) by mouth 2 (two) times daily as needed. 10/13/15   Rainey Pines, MD  losartan-hydrochlorothiazide (HYZAAR) 50-12.5 MG tablet Take 1 tablet by mouth daily.    [provider]  RABEprazole (ACIPHEX) 20 MG tablet Reported on 06/28/2015 11/10/14   [provider]  ranitidine (ZANTAC) 150 MG capsule Take 150 mg by mouth 2 (two) times daily.    [provider]  sucralfate (CARAFATE) 1 G tablet Take 1 g by mouth 4 (four) times daily -  with meals and at bedtime.    [provider]    Allergies Augmentin [amoxicillin-pot clavulanate]; Penicillin g; and Penicillins  Family History  Problem Relation Age of Onset  . Heart attack Father   . Heart disease Father   . Diabetes Father     Social History Social History   Tobacco Use  . Smoking status: Current Every Day Smoker    Types: E-cigarettes  . Smokeless tobacco: Current User    Types: Snuff  Substance Use Topics  . Alcohol use: No    Alcohol/week: 0.0 oz  . Drug use: No    Review of Systems Constitutional: No fever/chills ENT: No sore throat. Cardiovascular: Denies chest pain. Respiratory: Denies shortness of breath. Gastrointestinal: Positive for abdominal pain.  Positive for nausea,  no vomiting.  No diarrhea.  No constipation. Musculoskeletal: Negative for back pain. Neurological: Negative for headaches   ____________________________________________   PHYSICAL EXAM:  VITAL SIGNS: ED Triage Vitals  Enc Vitals Group     BP 12/17/16 1838 (!) 175/100     Pulse Rate 12/17/16 1838 96     Resp 12/17/16 1838 16     Temp 12/17/16 1838 98.9 F (37.2 C)     Temp Source 12/17/16 1838 Oral     SpO2 12/17/16 1838 97 %     Weight 12/17/16 1842  (!) 410 lb (186 kg)     Height 12/17/16 1842 6\' 3"  (1.905 m)     Head Circumference --      Peak Flow --      Pain Score 12/17/16 1842 8     Pain Loc --      Pain Edu? --      Excl. in [place]? --     Constitutional: Alert and oriented x4 somewhat anxious appearing nontoxic no diaphoresis speaks in full clear sentences Head: Atraumatic. Nose: No congestion/rhinnorhea. Mouth/Throat: No trismus Neck: No stridor.   Cardiovascular: Regular rate and rhythm Respiratory: Normal respiratory effort.  No retractions. Gastrointestinal: Morbidly obese soft nondistended nontender no rebound or guarding no peritonitis Neurologic:  Normal speech and language. No gross focal neurologic deficits are appreciated.  Skin:  Skin is warm, dry and intact. No rash noted.    ____________________________________________  LABS (all labs ordered are listed, but only abnormal results are displayed)  Labs Reviewed  COMPREHENSIVE METABOLIC PANEL - Abnormal; Notable for the following components:      Result Value   Chloride 100 (*)    Glucose, Bld 105 (*)    All other components within normal limits  URINALYSIS, COMPLETE (UACMP) WITH MICROSCOPIC - Abnormal; Notable for the following components:   Color, Urine YELLOW (*)    APPearance HAZY (*)    All other components within normal limits  LIPASE, BLOOD  CBC    Blood work reviewed by me with no acute disease __________________________________________  EKG   ____________________________________________  RADIOLOGY   ____________________________________________   DIFFERENTIAL includes but not limited to  Appendicitis, diverticulitis, functional abdominal pain, depression, renal colic   PROCEDURES  Procedure(s) performed: no  Procedures  Critical Care performed: no  Observation: no ____________________________________________   INITIAL IMPRESSION / ASSESSMENT AND PLAN / ED COURSE  Pertinent labs & imaging results that were available  during my care of the patient were reviewed by me and considered in my medical decision making (see chart for details).  I had a lengthy discussion with the patient regarding his chronic symptoms and today I did offer him morphine for pain control as well as a CT scan of his abdomen and pelvis however he declined.  He is understandably frustrated by his chronic symptoms that of yet to be diagnosed.  I have encouraged him to continue his follow-up with gastroenterology and to return to the emergency department for any concerns.  He verbalizes understanding and agreement the plan.      ____________________________________________   FINAL CLINICAL IMPRESSION(S) / ED DIAGNOSES  Final diagnoses:  Chronic abdominal pain      NEW MEDICATIONS STARTED DURING THIS VISIT:  This SmartLink is deprecated. Use AVSMEDLIST instead to display the medication list for a patient.   Note:  This document was prepared using Dragon voice recognition software and may include unintentional dictation errors.      Darel Hong, MD 12/19/16 1254

## 2016-12-17 NOTE — ED Triage Notes (Signed)
Pt to ED via POV with c/o abd pain x few months, pt was seen at [facility] and given antibiotics for methane SIBO (small intest bacteria overgrowth) in september . Pt states abd pain increased and decreased appetite. Pt in NAD at this time

## 2016-12-17 NOTE — ED Notes (Signed)

## 2016-12-28 IMAGING — US US ABDOMEN COMPLETE
1 series · 14 of 25 positions shown · non-contrast
Comparison: 10/25/2009

CLINICAL DATA: Generalized abdominal pain for 1 month.

EXAM:
ULTRASOUND ABDOMEN COMPLETE

[Series 1: us abdomen complete · 0.27mm/px · 14 of 78 slices shown]
[im 1/78]
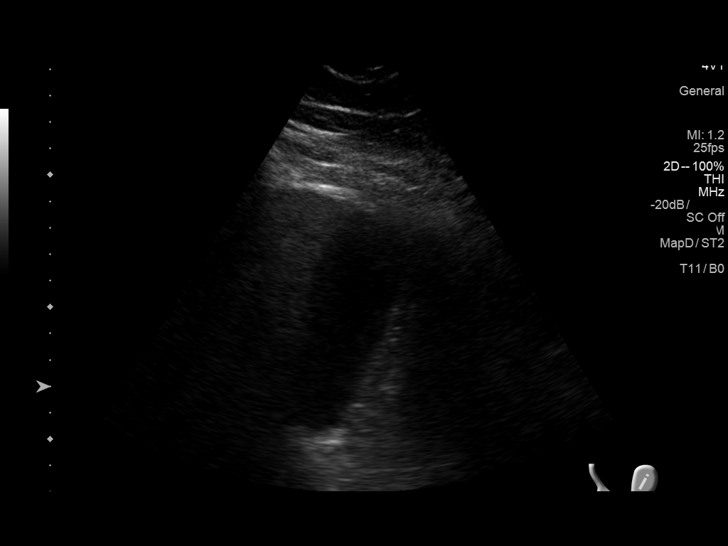
[im 7/78]
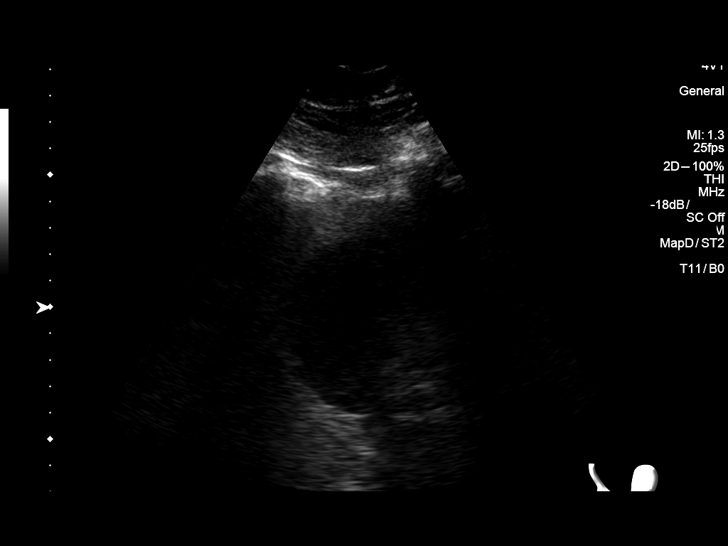
[im 13/78]
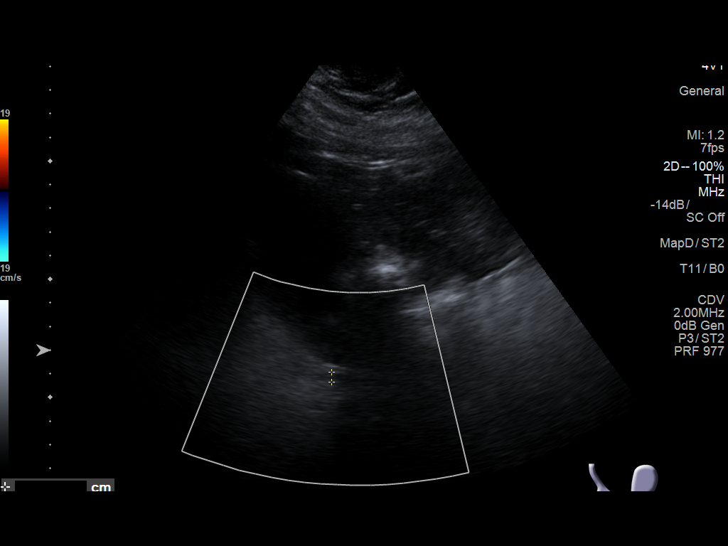
[im 20/78]
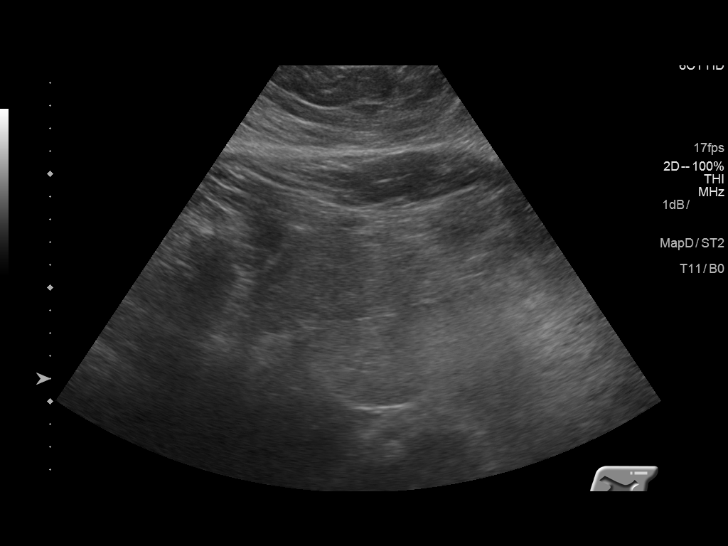
[im 26/78]
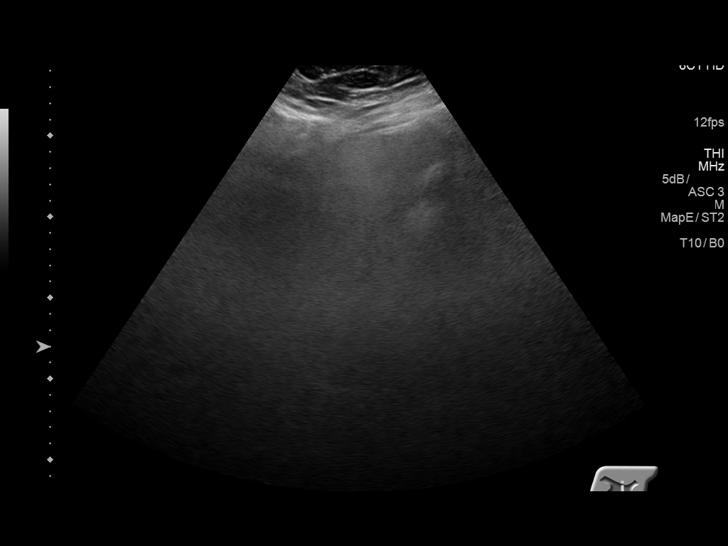
[im 29/78]
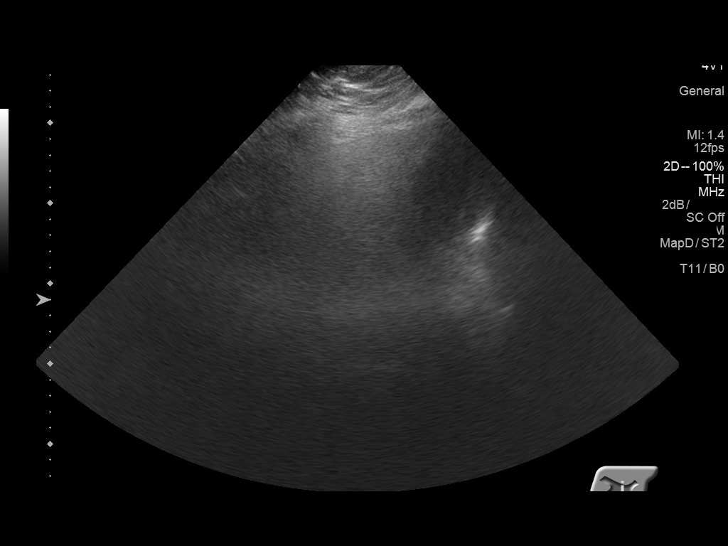
[im 36/78]
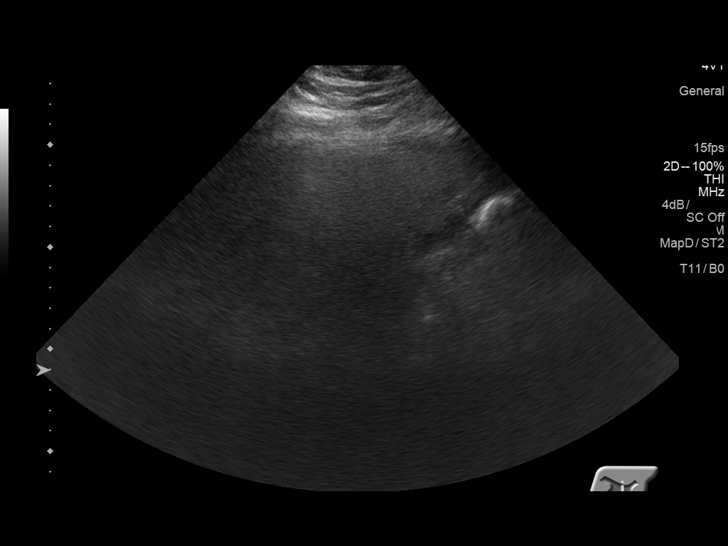
[im 42/78]
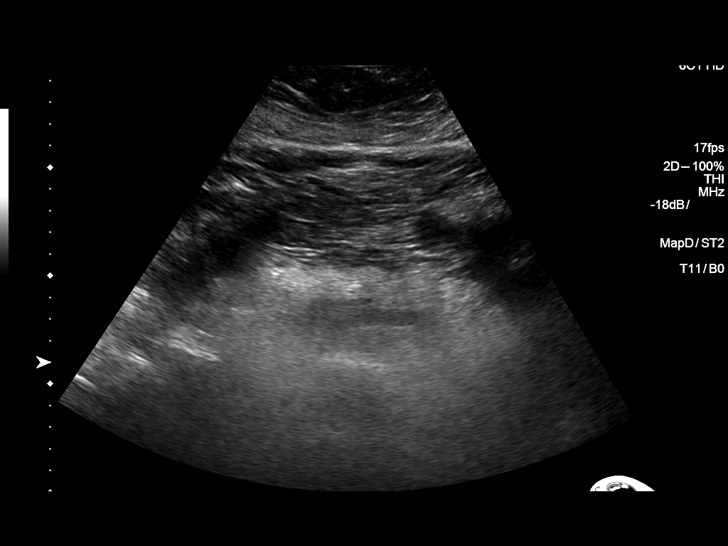
[im 49/78]
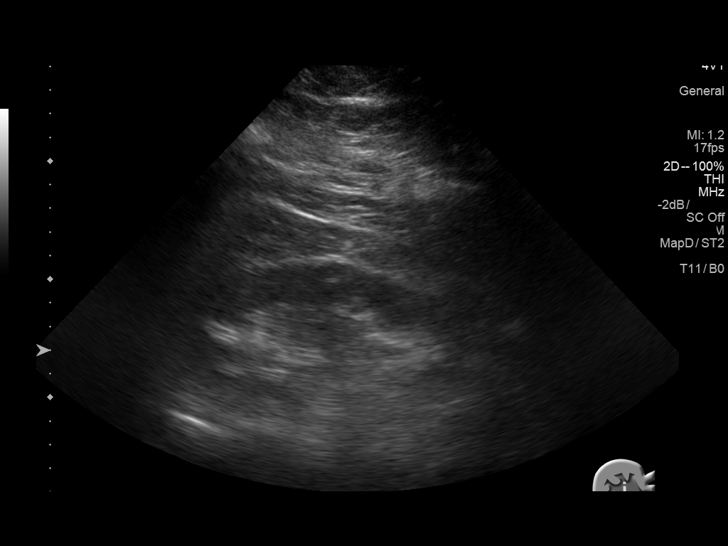
[im 52/78]
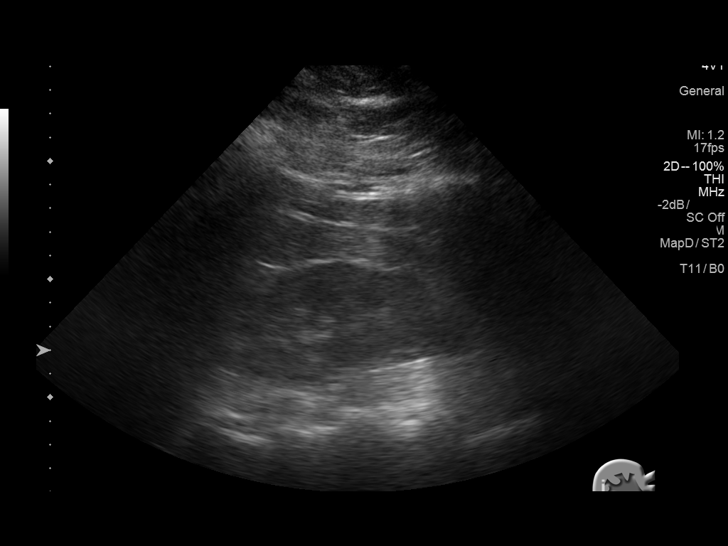
[im 58/78]
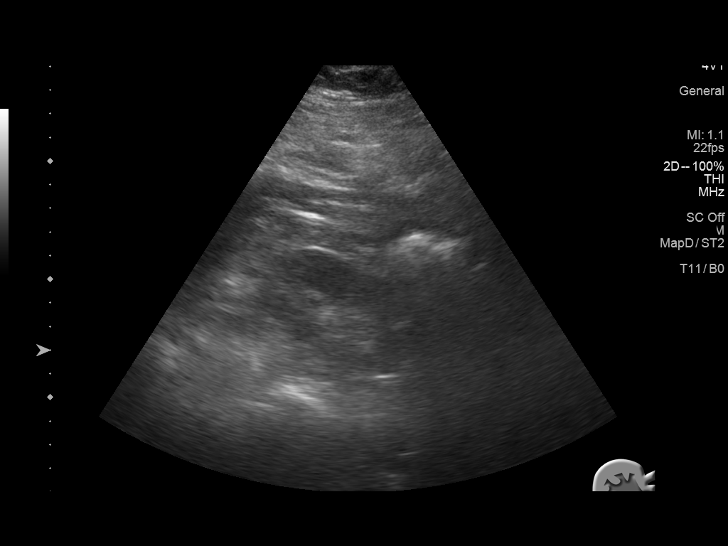
[im 65/78]
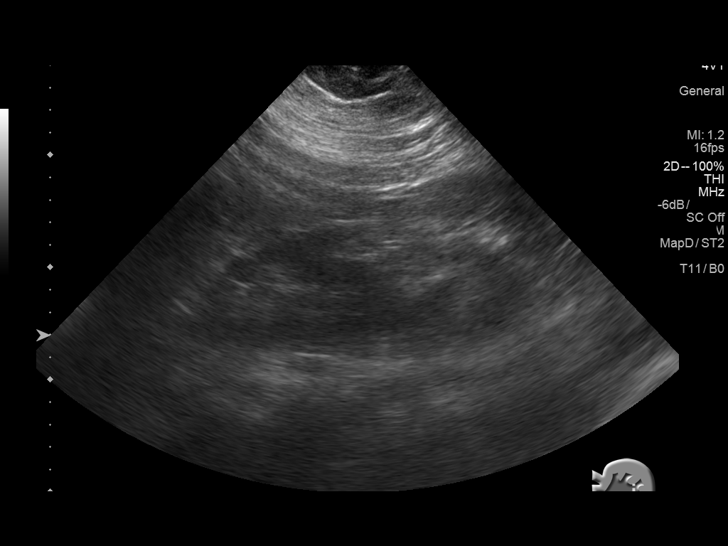
[im 71/78]
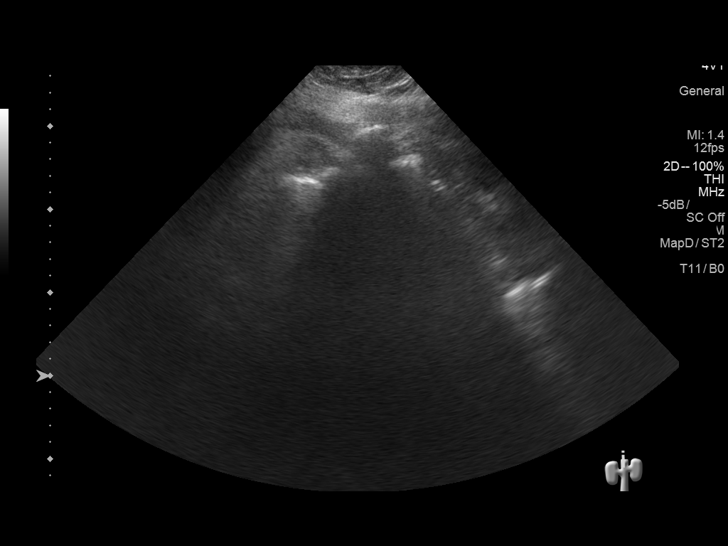
[im 78/78]
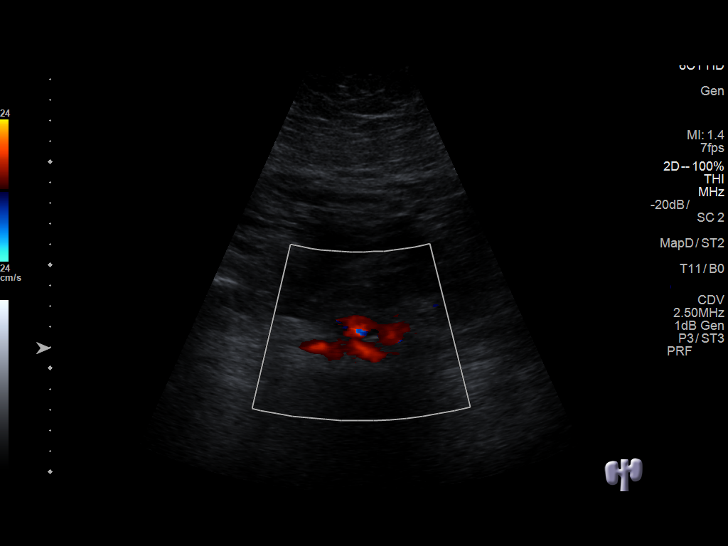

[14 of 25 positions shown; findings below may reference images not displayed]

FINDINGS: Gallbladder: No gallstones or wall thickening visualized. No
sonographic Murphy sign noted.

Common bile duct: Diameter: 4 mm, within normal limits.

Liver: Diffusely increased echogenicity of the hepatic parenchyma,
consistent with hepatic steatosis. No focal mass lesion identified.

IVC: No abnormality visualized.

Pancreas: Visualized portion unremarkable.

Spleen: Size and appearance within normal limits.

Right Kidney: Length: 13.2 cm. Echogenicity within normal limits. No
mass or hydronephrosis visualized.

Left Kidney: Length: 15.2 cm. Echogenicity within normal limits. No
mass or hydronephrosis visualized.

Abdominal aorta: No aneurysm visualized.

Other findings: None.
IMPRESSION: No evidence of cholelithiasis, biliary dilatation, or other acute
findings.

Diffuse hepatic steatosis, increased since 3855 exam.

## 2017-02-15 ENCOUNTER — Ambulatory Visit
Admission: RE | Admit: 2017-02-15 | Discharge: 2017-02-15 | Disposition: A | Discharge status: Home / Self Care | Payer: Managed Care, Other (non HMO) | Source: Ambulatory Visit | Attending: *Deleted | Admitting: *Deleted

## 2017-02-15 ENCOUNTER — Ambulatory Visit
Admission: RE | Admit: 2017-02-15 | Discharge: 2017-02-15 | Disposition: A | Discharge status: Home / Self Care | Payer: Managed Care, Other (non HMO) | Source: Ambulatory Visit | Attending: Otolaryngology | Admitting: Otolaryngology

## 2017-02-15 ENCOUNTER — Other Ambulatory Visit: Payer: Self-pay | Admitting: *Deleted

## 2017-02-15 DIAGNOSIS — J324 Chronic pansinusitis: Secondary | ICD-10-CM

## 2017-02-15 DIAGNOSIS — J329 Chronic sinusitis, unspecified: Secondary | ICD-10-CM | POA: Diagnosis not present

## 2017-02-15 DIAGNOSIS — R51 Headache: Secondary | ICD-10-CM | POA: Insufficient documentation

## 2018-09-26 ENCOUNTER — Other Ambulatory Visit: Payer: Self-pay | Admitting: Internal Medicine

## 2018-09-26 DIAGNOSIS — M79671 Pain in right foot: Secondary | ICD-10-CM

## 2018-09-26 DIAGNOSIS — M7671 Peroneal tendinitis, right leg: Secondary | ICD-10-CM

## 2018-10-09 ENCOUNTER — Other Ambulatory Visit: Payer: Self-pay

## 2018-10-09 ENCOUNTER — Ambulatory Visit
Admission: RE | Admit: 2018-10-09 | Discharge: 2018-10-09 | Disposition: A | Discharge status: Home / Self Care | Payer: Managed Care, Other (non HMO) | Source: Ambulatory Visit | Attending: Internal Medicine | Admitting: Internal Medicine

## 2018-10-09 DIAGNOSIS — M79671 Pain in right foot: Secondary | ICD-10-CM

## 2018-10-09 DIAGNOSIS — M7671 Peroneal tendinitis, right leg: Secondary | ICD-10-CM | POA: Diagnosis present

## 2018-10-24 ENCOUNTER — Ambulatory Visit (INDEPENDENT_AMBULATORY_CARE_PROVIDER_SITE_OTHER): Payer: Managed Care, Other (non HMO)

## 2018-10-24 ENCOUNTER — Other Ambulatory Visit: Payer: Self-pay | Admitting: Podiatry

## 2018-10-24 ENCOUNTER — Ambulatory Visit: Payer: Managed Care, Other (non HMO) | Admitting: Podiatry

## 2018-10-24 ENCOUNTER — Encounter: Payer: Self-pay | Admitting: Podiatry

## 2018-10-24 ENCOUNTER — Other Ambulatory Visit: Payer: Self-pay

## 2018-10-24 DIAGNOSIS — S93491A Sprain of other ligament of right ankle, initial encounter: Secondary | ICD-10-CM | POA: Diagnosis not present

## 2018-10-24 DIAGNOSIS — M79671 Pain in right foot: Secondary | ICD-10-CM | POA: Diagnosis not present

## 2018-10-24 NOTE — Patient Instructions (Signed)
Pre-Operative Instructions  Congratulations, you have decided to take an important step towards improving your quality of life.  You can be assured that the doctors and staff at [facility] will be with you every step of the way.  Here are some important things you should know:  1. Plan to be at the surgery center/hospital at least 1 (one) hour prior to your scheduled time, unless otherwise directed by the surgical center/hospital staff.  You must have a responsible adult accompany you, remain during the surgery and drive you home.  Make sure you have directions to the surgical center/hospital to ensure you arrive on time. 2. If you are having surgery at [facility] or [facility] hospitals, you will need a copy of your medical history and physical form from your family physician within one month prior to the date of surgery. We will give you a form for your primary physician to complete.  3. We make every effort to accommodate the date you request for surgery.  However, there are times where surgery dates or times have to be moved.  We will contact you as soon as possible if a change in schedule is required.   4. No aspirin/ibuprofen for one week before surgery.  If you are on aspirin, any non-steroidal anti-inflammatory medications (Mobic, Aleve, Ibuprofen) should not be taken seven (7) days prior to your surgery.  You make take Tylenol for pain prior to surgery.  5. Medications - If you are taking daily heart and blood pressure medications, seizure, reflux, allergy, asthma, anxiety, pain or diabetes medications, make sure you notify the surgery center/hospital before the day of surgery so they can tell you which medications you should take or avoid the day of surgery. 6. No food or drink after midnight the night before surgery unless directed otherwise by surgical center/hospital staff. 7. No alcoholic beverages 24-hours prior to surgery.  No smoking 24-hours prior or 24-hours after  surgery. 8. Wear loose pants or shorts. They should be loose enough to fit over bandages, boots, and casts. 9. Don't wear slip-on shoes. Sneakers are preferred. 10. Bring your boot with you to the surgery center/hospital.  Also bring crutches or a walker if your physician has prescribed it for you.  If you do not have this equipment, it will be provided for you after surgery. 11. If you have not been contacted by the surgery center/hospital by the day before your surgery, call to confirm the date and time of your surgery. 12. Leave-time from work may vary depending on the type of surgery you have.  Appropriate arrangements should be made prior to surgery with your employer. 13. Prescriptions will be provided immediately following surgery by your doctor.  Fill these as soon as possible after surgery and take the medication as directed. Pain medications will not be refilled on weekends and must be approved by the doctor. 14. Remove nail polish on the operative foot and avoid getting pedicures prior to surgery. 15. Wash the night before surgery.  The night before surgery wash the foot and leg well with water and the antibacterial soap provided. Be sure to pay special attention to beneath the toenails and in between the toes.  Wash for at least three (3) minutes. Rinse thoroughly with water and dry well with a towel.  Perform this wash unless told not to do so by your physician.  Enclosed: 1 Ice pack (please put in freezer the night before surgery)   1 Hibiclens skin cleaner     Pre-op instructions  If you have any questions regarding the instructions, please do not hesitate to call our office.  [place]: [address], [place], [place] [address] -- [phone]  [place]: [address] [place], [place] [address] -- [phone]  [place]: [address]  [place], [place] [address] -- [phone]   Website: [email or web address] 

## 2018-10-27 ENCOUNTER — Telehealth: Payer: Self-pay | Admitting: *Deleted

## 2018-10-27 ENCOUNTER — Encounter: Payer: Self-pay | Admitting: Podiatry

## 2018-10-27 NOTE — Telephone Encounter (Signed)
"  I saw Dr. Amalia Hailey on Friday.  I'm calling to schedule my surgery."  I don't have your paperwork at this time.  "I can call you back later this week.  I don't want to schedule surgery until January."  That would be great.  (Please send the paperwork.)

## 2018-10-29 ENCOUNTER — Ambulatory Visit: Payer: Managed Care, Other (non HMO) | Admitting: Podiatry

## 2018-10-29 NOTE — Progress Notes (Signed)
HPI: 43 y.o. male presenting today as a new patient for evaluation of pain associated to the right foot and ankle for the past 4 months.  Pain is currently 6/10.  This is been ongoing for the past several months and he denies any significant incident or injury to initiate the symptoms.  He has been treated by other physicians in the past and he presents today for second opinion.  He did have an MRI of the ankle performed on 10/09/2018.  He would like to discuss the MRI findings and discuss different treatment options.  He was told apparently at the last doctor's office visit at [facility] clinic that he would need surgery.  Past Medical History:  Diagnosis Date  . Anxiety   . Depression   . GERD (gastroesophageal reflux disease)   . Hyperlipidemia   . Hypertension   . IBS (irritable bowel syndrome)   . Sleep apnea      Physical Exam: General: The patient is alert and oriented x3 in no acute distress.  Dermatology: Skin is warm, dry and supple bilateral lower extremities. Negative for open lesions or macerations.  Vascular: Palpable pedal pulses bilaterally. No edema or erythema noted. Capillary refill within normal limits.  Neurological: Epicritic and protective threshold grossly intact bilaterally.   Musculoskeletal Exam: Range of motion within normal limits to all pedal and ankle joints bilateral. Muscle strength 5/5 in all groups bilateral.  There is some pain on palpation overlying the anterior lateral aspect of the right ankle joint.  Pain on palpation also noted to the fifth metatarsal tubercle right foot.  Positive anterior drawer sign noted to the ankle.  Radiographic Exam:  Normal osseous mineralization. Joint spaces preserved.  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  There is also associated pain on palpation to this area and the patient has been symptomatic in this area for several months now.  MRI impression 10/09/2018: Complete  ATFL tear appears chronic. Cystic change and mild edema at the attachment site of the posterior talofibular ligament is likely due to altered mechanics and sprain without tear.   Assessment: 1.  Chronic complete ATFL tear right 2.  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 Patient evaluated. X-Rays reviewed.  2.  Patient has had multiple conservative treatment modalities performed without any significant improvement.  I do agree with prior surgeons recommendation of surgical intervention.  The patient does agree with this protocol.  Conservatively he has tried anti-inflammatories, injections, immobilization cam boot without any significant improvement 3.  Would like to have surgery performed in January.  Today the authorization for surgery was initiated.  Surgery will consist of lateral ankle stabilization/ATFL repair right.  Fifth metatarsal tubercle exostectomy with repair peroneal tendon right 4.  All possible complications and details the procedure were explained.  No guarantees were expressed or implied.  All patient questions were answered. 5.  Return to clinic 1-2 weeks prior to surgery for final surgical consultation and to answer any questions.      Edrick Kins, DPM [facility]  Dr. Edrick Kins, DPM    [address], [place] [address]  Office [phone]  Fax [phone]

## 2018-10-30 NOTE — Telephone Encounter (Signed)
I attempted to return his call.  I left him a message to call me on tomorrow.  "It was the weirdest thing, my phone didn't ring but a missed call showed up."  I was returning your call.  I got your paperwork today.  You want to schedule it in January, correct?  "Yes, only because my short term disability will be covered at 100%.  I'm going to try to hold off until then."  Do you have a date that you like?  "Can we schedule it for January 14?"  Yes, I'll get it scheduled for 02/05/2019.  Someone from the surgical center will give you a call a day or two prior to your surgery date and will give you your arrival time.  You need to register online via the surgical center's portal, the instructions are in the brochure that we gave you.  "Okay, I'll take care of it."

## 2018-11-27 ENCOUNTER — Other Ambulatory Visit: Payer: Self-pay | Admitting: *Deleted

## 2018-11-27 DIAGNOSIS — S93491A Sprain of other ligament of right ankle, initial encounter: Secondary | ICD-10-CM

## 2018-11-27 NOTE — Progress Notes (Signed)
I sent a message to Jolee Ewing, [place], to request a knee scooter for the patient.  He's scheduled for surgery on 02/05/2019.

## 2019-01-13 ENCOUNTER — Telehealth: Payer: Self-pay | Admitting: *Deleted

## 2019-01-13 NOTE — Telephone Encounter (Signed)
"  I'm scheduled to have surgery on January 14 with Dr. Amalia Hailey.  I think I'm going to need a knee scooter.  I'm having a problem finding one for my weight and height.  I did come across one called [employer] but they're not in-network with my insurance.  So, I was told to call you to see if you could help me find one.  So, could you please give me a call?"

## 2019-01-20 ENCOUNTER — Ambulatory Visit: Payer: Managed Care, Other (non HMO) | Admitting: Podiatry

## 2019-01-22 NOTE — Telephone Encounter (Signed)
I am returning your call.  The only company we use that has the knee scooter is [employer].  You may want to try [facility].  "I found one called the [employer] that fits my weight and height.  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  "Do you know what time I'm supposed to get there for my surgery?"  Someone from the surgical center will give you a call a day or two prior to your surgery date and will give you your arrival time.  They don't like to give the arrival times way in advance because they may have cancellations or have patients that are Diabetic or children, that they like to do surgeries for first.  "Okay, thank you for your help."

## 2019-01-27 ENCOUNTER — Encounter: Payer: Self-pay | Admitting: Podiatry

## 2019-01-27 ENCOUNTER — Ambulatory Visit: Payer: Managed Care, Other (non HMO) | Admitting: Podiatry

## 2019-02-02 ENCOUNTER — Telehealth: Payer: Self-pay | Admitting: *Deleted

## 2019-02-02 NOTE — Telephone Encounter (Signed)
DOS 02/05/2019 OSTECTOMY COMPLETE METATARSAL HEAD 5TH - [address]112, REPAIR PERONEAL TENDON - [phone], AND ANKLE STABILIZATION - 27695  [employer]: Eligibility Date - 01/22/2017  Deductible - $1750 / $0 met Co-Insurance - 80% Out of pocket - $6000 / $8.82 met  Pre-certification is NOT REQUIRED per Regino Schultze and Automated system.  Call Ref# [id number]

## 2019-02-05 ENCOUNTER — Other Ambulatory Visit: Payer: Self-pay | Admitting: Podiatry

## 2019-02-05 ENCOUNTER — Encounter: Payer: Self-pay | Admitting: Podiatry

## 2019-02-05 DIAGNOSIS — S86111D Strain of other muscle(s) and tendon(s) of posterior muscle group at lower leg level, right leg, subsequent encounter: Secondary | ICD-10-CM

## 2019-02-05 DIAGNOSIS — D1631 Benign neoplasm of short bones of right lower limb: Secondary | ICD-10-CM | POA: Diagnosis not present

## 2019-02-05 DIAGNOSIS — S93491A Sprain of other ligament of right ankle, initial encounter: Secondary | ICD-10-CM | POA: Diagnosis not present

## 2019-02-05 MED ORDER — OXYCODONE-ACETAMINOPHEN 5-325 MG PO TABS: 1.0000 | ORAL_TABLET | Freq: Four times a day (QID) | ORAL | 0 refills | Status: DC | PRN

## 2019-02-05 NOTE — Progress Notes (Signed)
PRN postop 

## 2019-02-10 ENCOUNTER — Telehealth: Payer: Self-pay | Admitting: Podiatry

## 2019-02-10 NOTE — Telephone Encounter (Signed)
Hi Angie  Pt calling to say that he has blood the size quarter coming thru his bandages DOS 02/05/19  Pt states he is concerned and would like to be seen

## 2019-02-10 NOTE — Telephone Encounter (Signed)
I returned patient call, he was concerned for blood through bandage.  I asked if his bandage was wet and saturated, he said it was hard and dried.  I informed him that this was normal and to keep foot elevated.  I educated him on s/s of infection and to call office immediately if so.  He will keep his appt for this Friday for first post op visit.

## 2019-02-13 ENCOUNTER — Ambulatory Visit (INDEPENDENT_AMBULATORY_CARE_PROVIDER_SITE_OTHER): Payer: Managed Care, Other (non HMO) | Admitting: Podiatry

## 2019-02-13 ENCOUNTER — Other Ambulatory Visit: Payer: Self-pay

## 2019-02-13 ENCOUNTER — Ambulatory Visit (INDEPENDENT_AMBULATORY_CARE_PROVIDER_SITE_OTHER): Payer: Managed Care, Other (non HMO)

## 2019-02-13 DIAGNOSIS — M79671 Pain in right foot: Secondary | ICD-10-CM

## 2019-02-13 DIAGNOSIS — Z9889 Other specified postprocedural states: Secondary | ICD-10-CM

## 2019-02-16 NOTE — Progress Notes (Signed)
   Subjective:  Patient presents today status post right ankle surgery. DOS: 02/05/2019. He states he is doing well. He reports some associated swelling. He denies any pain or modifying factors. He has been using the CAM boot and knee scooter as directed. Patient is here for further evaluation and treatment.    Past Medical History:  Diagnosis Date  . Anxiety   . Depression   . GERD (gastroesophageal reflux disease)   . Hyperlipidemia   . Hypertension   . IBS (irritable bowel syndrome)   . Sleep apnea       Objective/Physical Exam Neurovascular status intact.  Skin incisions appear to be well coapted with sutures and staples intact. No sign of infectious process noted. No dehiscence. No active bleeding noted. Moderate edema noted to the surgical extremity.  Radiographic Exam:  Normal osseous mineralization. Joint spaces preserved. No fracture/dislocation/boney destruction.    Assessment: 1. s/p right ankle surgery. DOS: 02/05/2019   Plan of Care:  1. Patient was evaluated. X-rays reviewed 2. Dressing changed. Keep clean, dry and intact for one week.  3. Continue nonweightbearing in CAM boot with knee scooter.  4. Return to clinic in one week.    Edrick Kins, DPM [facility]  Dr. Edrick Kins, [place]                                        [place], [place] [address]                Office [phone]  Fax [phone]

## 2019-02-24 ENCOUNTER — Ambulatory Visit (INDEPENDENT_AMBULATORY_CARE_PROVIDER_SITE_OTHER): Payer: Managed Care, Other (non HMO) | Admitting: Podiatry

## 2019-02-24 ENCOUNTER — Encounter: Payer: Self-pay | Admitting: Podiatry

## 2019-02-24 ENCOUNTER — Other Ambulatory Visit: Payer: Self-pay

## 2019-02-24 DIAGNOSIS — S93491A Sprain of other ligament of right ankle, initial encounter: Secondary | ICD-10-CM

## 2019-02-24 DIAGNOSIS — Z9889 Other specified postprocedural states: Secondary | ICD-10-CM

## 2019-02-27 NOTE — Progress Notes (Signed)
   Subjective:  Patient presents today status post right ankle surgery. DOS: 02/05/2019. He states he is doing well overall. He reports some continued intermittent pain and associated swelling. He has been using the CAM boot and knee scooter as instructed. He denies any worsening factors at this time. Patient is here for further evaluation and treatment.   Past Medical History:  Diagnosis Date  . Anxiety   . Depression   . GERD (gastroesophageal reflux disease)   . Hyperlipidemia   . Hypertension   . IBS (irritable bowel syndrome)   . Sleep apnea       Objective/Physical Exam Neurovascular status intact.  Skin incisions appear to be well coapted with sutures and staples intact. No sign of infectious process noted. No dehiscence. No active bleeding noted. Moderate edema noted to the surgical extremity.  Assessment: 1. s/p right ankle surgery. DOS: 02/05/2019   Plan of Care:  1. Patient was evaluated. 2. Partial staples removed.  3. Dry sterile dressing applied. Keep clean, dry and intact for two weeks.  4. Continue nonweightbearing in CAM boot with knee scooter.  5. Return to clinic in 2 weeks to initiate physical therapy.    Edrick Kins, DPM [facility]  Dr. Edrick Kins, [address]                                        [place], [place] [address]                Office [phone]  Fax [phone]

## 2019-03-02 ENCOUNTER — Encounter: Payer: Self-pay | Admitting: Podiatry

## 2019-03-02 ENCOUNTER — Telehealth: Payer: Self-pay | Admitting: *Deleted

## 2019-03-02 NOTE — Telephone Encounter (Signed)
I attempted to call Steven Lucero to inform him that we have not received his FMLA paperwork.  The last time time we sent out FMLA for him was 02/20/2019.  I asked him to give me a call back.

## 2019-03-02 NOTE — Telephone Encounter (Signed)
"  I'm calling about my disability.  They said they sent over some paperwork that needs to be completed.  So, I'm calling to see if you got it.  I can't be going back to work.  I'm still non-weight bearing.

## 2019-03-13 ENCOUNTER — Ambulatory Visit (INDEPENDENT_AMBULATORY_CARE_PROVIDER_SITE_OTHER): Payer: Managed Care, Other (non HMO) | Admitting: Podiatry

## 2019-03-13 ENCOUNTER — Other Ambulatory Visit: Payer: Self-pay

## 2019-03-13 DIAGNOSIS — Z9889 Other specified postprocedural states: Secondary | ICD-10-CM

## 2019-03-13 DIAGNOSIS — M79671 Pain in right foot: Secondary | ICD-10-CM

## 2019-03-13 DIAGNOSIS — S93491A Sprain of other ligament of right ankle, initial encounter: Secondary | ICD-10-CM

## 2019-03-13 NOTE — Telephone Encounter (Signed)
I'm returning your call.  "I just wanted to make sure the disability people sent over that paperwork."  Yes, they did.  I'll take care of it.  "Good, I just wanted to make sure they held up their end."  Steven Lucero, please put in a charge for forms sent completed for 03/06/2019 and new forms sent today, 03/13/2019.) - $20.00

## 2019-03-16 NOTE — Progress Notes (Signed)
   Subjective:  Patient presents today status post right ankle surgery. DOS: 02/05/2019. He states he is doing well. He reports some intermittent swelling and redness. He denies any pain or modifying factors. He has been using the CAM boot as directed. Patient is here for further evaluation and treatment.   Past Medical History:  Diagnosis Date  . Anxiety   . Depression   . GERD (gastroesophageal reflux disease)   . Hyperlipidemia   . Hypertension   . IBS (irritable bowel syndrome)   . Sleep apnea       Objective/Physical Exam Neurovascular status intact.  Skin incisions appear to be well coapted with sutures and staples intact. No sign of infectious process noted. No dehiscence. No active bleeding noted. Moderate edema noted to the surgical extremity.  Assessment: 1. s/p right ankle surgery. DOS: 02/05/2019   Plan of Care:  1. Patient was evaluated. 2. Staples removed.  3. Orders for physical therapy from [facility] placed.  4. Begin weightbearing in CAM boot.  5. Return to clinic in 3 weeks.   Shooting to return to work on 04/06/2019. We can extend if needed.    Edrick Kins, DPM [facility]  Dr. Edrick Kins, [address]                                        [place], [place] [address]                Office [phone]  Fax [phone]

## 2019-04-03 ENCOUNTER — Other Ambulatory Visit: Payer: Self-pay

## 2019-04-03 ENCOUNTER — Ambulatory Visit (INDEPENDENT_AMBULATORY_CARE_PROVIDER_SITE_OTHER): Payer: Managed Care, Other (non HMO) | Admitting: Podiatry

## 2019-04-03 ENCOUNTER — Ambulatory Visit (INDEPENDENT_AMBULATORY_CARE_PROVIDER_SITE_OTHER): Payer: Managed Care, Other (non HMO)

## 2019-04-03 DIAGNOSIS — S93491A Sprain of other ligament of right ankle, initial encounter: Secondary | ICD-10-CM

## 2019-04-03 DIAGNOSIS — Z9889 Other specified postprocedural states: Secondary | ICD-10-CM

## 2019-04-05 NOTE — Progress Notes (Signed)
   Subjective:  Patient presents today status post right ankle surgery. DOS: 02/05/2019. He states he is doing well and improving. He reports doing physical therapy three times weekly and has been doing the exercises with a regular shoe. He reports some minor intermittent soreness and mild swelling. Patient is here for further evaluation and treatment.   Past Medical History:  Diagnosis Date  . Anxiety   . Depression   . GERD (gastroesophageal reflux disease)   . Hyperlipidemia   . Hypertension   . IBS (irritable bowel syndrome)   . Sleep apnea       Objective/Physical Exam Neurovascular status intact.  Skin incisions appear to be well coapted. No sign of infectious process noted. No dehiscence. No active bleeding noted. Moderate edema noted to the surgical extremity.  Radiographic Exam:  Orthopedic hardware and osteotomies sites appear to be stable with routine healing.  Assessment: 1. s/p right ankle surgery. DOS: 02/05/2019   Plan of Care:  1. Patient was evaluated. X-Rays reviewed.  2. Return to work on 04/06/2019 at full activity with no restrictions.  3. Continue physical therapy at [facility].  4. Ankle brace dispensed. Discontinue using CAM boot.  5. Return to clinic as needed.   Shooting to return to work on 04/06/2019. We can extend if needed.    Edrick Kins, DPM [facility]  Dr. Edrick Kins, [address]                                        [place], [place] [address]                Office [phone]  Fax [phone]

## 2019-07-23 ENCOUNTER — Encounter: Payer: Self-pay | Admitting: Podiatry

## 2019-08-11 ENCOUNTER — Ambulatory Visit (INDEPENDENT_AMBULATORY_CARE_PROVIDER_SITE_OTHER): Payer: Managed Care, Other (non HMO)

## 2019-08-11 ENCOUNTER — Other Ambulatory Visit: Payer: Self-pay | Admitting: Podiatry

## 2019-08-11 ENCOUNTER — Encounter: Payer: Self-pay | Admitting: Podiatry

## 2019-08-11 ENCOUNTER — Other Ambulatory Visit: Payer: Self-pay

## 2019-08-11 ENCOUNTER — Ambulatory Visit (INDEPENDENT_AMBULATORY_CARE_PROVIDER_SITE_OTHER): Payer: Managed Care, Other (non HMO) | Admitting: Podiatry

## 2019-08-11 DIAGNOSIS — M7671 Peroneal tendinitis, right leg: Secondary | ICD-10-CM

## 2019-08-11 DIAGNOSIS — S93491A Sprain of other ligament of right ankle, initial encounter: Secondary | ICD-10-CM | POA: Diagnosis not present

## 2019-08-11 NOTE — Progress Notes (Signed)
   HPI: 44 y.o. male presenting today for evaluation of continued right foot and ankle pain.  Patient does have history of lateral ankle stabilization surgery to the right lower extremity.  DOS: 02/05/2019.  Patient states that the surgery has been doing very well and he exercises with regular shoes.  He states that it when he is on uneven surfaces he feels that his right ankle is going to give out on him.  He also experiences some pain to the lateral aspect of the ankle.  He presents for further treatment and evaluation  Past Medical History:  Diagnosis Date  . Anxiety   . Depression   . GERD (gastroesophageal reflux disease)   . Hyperlipidemia   . Hypertension   . IBS (irritable bowel syndrome)   . Sleep apnea      Physical Exam: General: The patient is alert and oriented x3 in no acute distress.  Dermatology: Skin is warm, dry and supple bilateral lower extremities. Negative for open lesions or macerations.  Vascular: Palpable pedal pulses bilaterally. No edema or erythema noted. Capillary refill within normal limits.  Neurological: Epicritic and protective threshold grossly intact bilaterally.   Musculoskeletal Exam: Range of motion within normal limits to all pedal and ankle joints bilateral. Muscle strength 5/5 in all groups bilateral.  There is some pain throughout palpation of the peroneal tendons right lower extremity.  Pain appears to be just posterior and distal to the fibular malleolus  Radiographic Exam:  Normal osseous mineralization. Joint spaces preserved. No fracture/dislocation/boney destruction.    Assessment: 1.  Peroneal tendinitis right   Plan of Care:  1. Patient evaluated. X-Rays reviewed.  2.  Recommend good supportive ankle brace when the patient is walking on uneven surfaces, such as mowing 3.  Recommend good supportive shoes 4.  Continue full activity with no restrictions     Edrick Kins, DPM [facility]  Dr. Edrick Kins, DPM     [address], [place] [address]                Office [phone]  Fax [phone]

## 2019-08-27 ENCOUNTER — Other Ambulatory Visit: Payer: Self-pay | Admitting: Gastroenterology

## 2019-08-27 DIAGNOSIS — R1013 Epigastric pain: Secondary | ICD-10-CM

## 2019-09-02 ENCOUNTER — Ambulatory Visit
Admission: RE | Admit: 2019-09-02 | Discharge: 2019-09-02 | Disposition: A | Discharge status: Home / Self Care | Payer: Managed Care, Other (non HMO) | Source: Ambulatory Visit | Attending: Gastroenterology | Admitting: Gastroenterology

## 2019-09-02 ENCOUNTER — Ambulatory Visit: Payer: Managed Care, Other (non HMO)

## 2019-09-02 ENCOUNTER — Other Ambulatory Visit: Payer: Self-pay

## 2019-09-02 DIAGNOSIS — R1013 Epigastric pain: Secondary | ICD-10-CM | POA: Insufficient documentation

## 2020-09-07 ENCOUNTER — Encounter: Payer: Self-pay | Admitting: Physical Therapy

## 2020-09-07 ENCOUNTER — Ambulatory Visit: Payer: Managed Care, Other (non HMO) | Admitting: Physical Therapy

## 2020-09-19 ENCOUNTER — Encounter: Payer: Managed Care, Other (non HMO) | Admitting: Physical Therapy

## 2020-10-03 ENCOUNTER — Encounter: Payer: Managed Care, Other (non HMO) | Admitting: Physical Therapy

## 2020-10-05 ENCOUNTER — Emergency Department (HOSPITAL_COMMUNITY): Payer: Managed Care, Other (non HMO)

## 2020-10-05 ENCOUNTER — Other Ambulatory Visit (HOSPITAL_COMMUNITY): Payer: Self-pay | Admitting: Radiology

## 2020-10-05 ENCOUNTER — Emergency Department (HOSPITAL_COMMUNITY)
Admission: EM | Admit: 2020-10-05 | Discharge: 2020-10-22 | Disposition: E | Payer: Managed Care, Other (non HMO) | Attending: Emergency Medicine | Admitting: Emergency Medicine

## 2020-10-05 DIAGNOSIS — S0990XA Unspecified injury of head, initial encounter: Secondary | ICD-10-CM | POA: Diagnosis present

## 2020-10-05 DIAGNOSIS — F17290 Nicotine dependence, other tobacco product, uncomplicated: Secondary | ICD-10-CM | POA: Insufficient documentation

## 2020-10-05 DIAGNOSIS — R579 Shock, unspecified: Secondary | ICD-10-CM | POA: Diagnosis not present

## 2020-10-05 DIAGNOSIS — M549 Dorsalgia, unspecified: Secondary | ICD-10-CM | POA: Insufficient documentation

## 2020-10-05 DIAGNOSIS — S3991XA Unspecified injury of abdomen, initial encounter: Secondary | ICD-10-CM | POA: Diagnosis not present

## 2020-10-05 DIAGNOSIS — I10 Essential (primary) hypertension: Secondary | ICD-10-CM | POA: Insufficient documentation

## 2020-10-05 DIAGNOSIS — Z20822 Contact with and (suspected) exposure to covid-19: Secondary | ICD-10-CM | POA: Diagnosis not present

## 2020-10-05 DIAGNOSIS — S299XXA Unspecified injury of thorax, initial encounter: Secondary | ICD-10-CM | POA: Diagnosis not present

## 2020-10-05 DIAGNOSIS — S32009A Unspecified fracture of unspecified lumbar vertebra, initial encounter for closed fracture: Secondary | ICD-10-CM

## 2020-10-05 DIAGNOSIS — R0603 Acute respiratory distress: Secondary | ICD-10-CM | POA: Insufficient documentation

## 2020-10-05 DIAGNOSIS — Y92410 Unspecified street and highway as the place of occurrence of the external cause: Secondary | ICD-10-CM | POA: Diagnosis not present

## 2020-10-05 DIAGNOSIS — S2242XA Multiple fractures of ribs, left side, initial encounter for closed fracture: Secondary | ICD-10-CM

## 2020-10-05 DIAGNOSIS — R0602 Shortness of breath: Secondary | ICD-10-CM | POA: Insufficient documentation

## 2020-10-05 DIAGNOSIS — Z79899 Other long term (current) drug therapy: Secondary | ICD-10-CM | POA: Diagnosis not present

## 2020-10-05 DIAGNOSIS — I469 Cardiac arrest, cause unspecified: Secondary | ICD-10-CM

## 2020-10-05 DIAGNOSIS — S329XXA Fracture of unspecified parts of lumbosacral spine and pelvis, initial encounter for closed fracture: Secondary | ICD-10-CM

## 2020-10-05 DIAGNOSIS — J9601 Acute respiratory failure with hypoxia: Secondary | ICD-10-CM

## 2020-10-05 DIAGNOSIS — R200 Anesthesia of skin: Secondary | ICD-10-CM | POA: Insufficient documentation

## 2020-10-05 DIAGNOSIS — R000 Tachycardia, unspecified: Secondary | ICD-10-CM | POA: Insufficient documentation

## 2020-10-05 LAB — PROTIME-INR
INR: 1.1 (ref 0.8–1.2)
Prothrombin Time: 14.4 seconds (ref 11.4–15.2)

## 2020-10-05 LAB — I-STAT CHEM 8, ED
BUN: 23 mg/dL — ABNORMAL HIGH (ref 6–20)
Calcium, Ion: 1.04 mmol/L — ABNORMAL LOW (ref 1.15–1.40)
Chloride: 108 mmol/L (ref 98–111)
Creatinine, Ser: 1.5 mg/dL — ABNORMAL HIGH (ref 0.61–1.24)
Glucose, Bld: 172 mg/dL — ABNORMAL HIGH (ref 70–99)
HCT: 41 % (ref 39.0–52.0)
Hemoglobin: 13.9 g/dL (ref 13.0–17.0)
Potassium: 3.6 mmol/L (ref 3.5–5.1)
Sodium: 140 mmol/L (ref 135–145)
TCO2: 23 mmol/L (ref 22–32)

## 2020-10-05 LAB — COMPREHENSIVE METABOLIC PANEL
ALT: 47 U/L — ABNORMAL HIGH (ref 0–44)
AST: 64 U/L — ABNORMAL HIGH (ref 15–41)
Albumin: 2.9 g/dL — ABNORMAL LOW (ref 3.5–5.0)
Alkaline Phosphatase: 54 U/L (ref 38–126)
Anion gap: 8 (ref 5–15)
BUN: 21 mg/dL — ABNORMAL HIGH (ref 6–20)
CO2: 23 mmol/L (ref 22–32)
Calcium: 7.5 mg/dL — ABNORMAL LOW (ref 8.9–10.3)
Chloride: 107 mmol/L (ref 98–111)
Creatinine, Ser: 1.45 mg/dL — ABNORMAL HIGH (ref 0.61–1.24)
GFR, Estimated: >60 mL/min (ref >60)
Glucose, Bld: 185 mg/dL — ABNORMAL HIGH (ref 70–99)
Potassium: 3.8 mmol/L (ref 3.5–5.1)
Sodium: 138 mmol/L (ref 135–145)
Total Bilirubin: 0.7 mg/dL (ref 0.3–1.2)
Total Protein: 5.5 g/dL — ABNORMAL LOW (ref 6.5–8.1)

## 2020-10-05 LAB — SAMPLE TO BLOOD BANK

## 2020-10-05 LAB — CBC
HCT: 43.1 % (ref 39.0–52.0)
Hemoglobin: 13.6 g/dL (ref 13.0–17.0)
MCH: 27.5 pg (ref 26.0–34.0)
MCHC: 31.6 g/dL (ref 30.0–36.0)
MCV: 87.2 fL (ref 80.0–100.0)
Platelets: 265 10*3/uL (ref 150–400)
RBC: 4.94 MIL/uL (ref 4.22–5.81)
RDW: 15.7 % — ABNORMAL HIGH (ref 11.5–15.5)
WBC: 19.8 10*3/uL — ABNORMAL HIGH (ref 4.0–10.5)
nRBC: 0 % (ref 0.0–0.2)

## 2020-10-05 LAB — ABO/RH: ABO/RH(D): O POS

## 2020-10-05 LAB — ETHANOL: Alcohol, Ethyl (B): <10 mg/dL (ref <10)

## 2020-10-05 MED ORDER — MIDAZOLAM HCL 2 MG/2ML IJ SOLN
INTRAMUSCULAR | Status: AC
  Filled 2020-10-05: qty 2

## 2020-10-05 MED ORDER — FENTANYL CITRATE PF 50 MCG/ML IJ SOSY
PREFILLED_SYRINGE | INTRAMUSCULAR | Status: AC
  Filled 2020-10-05: qty 2

## 2020-10-05 MED ORDER — SUCCINYLCHOLINE CHLORIDE 200 MG/10ML IV SOSY
PREFILLED_SYRINGE | INTRAVENOUS | Status: AC
  Filled 2020-10-05: qty 10

## 2020-10-05 MED ORDER — NOREPINEPHRINE 4 MG/250ML-% IV SOLN
INTRAVENOUS | Status: AC
  Filled 2020-10-05: qty 250

## 2020-10-05 MED ORDER — IOHEXOL 350 MG/ML SOLN
80.0000 mL | Freq: Once | INTRAVENOUS | Status: AC | PRN
  Administered 2020-10-05: 80 mL via INTRAVENOUS

## 2020-10-05 MED ORDER — KETAMINE HCL 50 MG/5ML IJ SOSY
PREFILLED_SYRINGE | INTRAMUSCULAR | Status: AC
  Filled 2020-10-05: qty 5

## 2020-10-05 MED ORDER — LORAZEPAM 2 MG/ML IJ SOLN
1.0000 mg | Freq: Once | INTRAMUSCULAR | Status: AC
  Administered 2020-10-05: 1 mg via INTRAVENOUS
  Filled 2020-10-05: qty 1

## 2020-10-05 MED ORDER — ETOMIDATE 2 MG/ML IV SOLN
INTRAVENOUS | Status: AC
  Filled 2020-10-05: qty 20

## 2020-10-05 MED ORDER — ROCURONIUM BROMIDE 10 MG/ML (PF) SYRINGE
PREFILLED_SYRINGE | INTRAVENOUS | Status: AC
  Filled 2020-10-05: qty 10
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
## 2020-10-05 NOTE — Procedures (Signed)
Central line  Date/Time: 09/25/2020 10:55 PM Performed by: Rolm Bookbinder, MD Authorized by: Rolm Bookbinder, MD   Consent:    Consent obtained:  Verbal   Consent given by:  Patient Pre-procedure details:    Indication(s): insufficient peripheral access     Hand hygiene: Hand hygiene performed prior to insertion     Skin preparation:  Chlorhexidine Sedation:    Sedation type:  None Anesthesia:    Anesthesia method:  Local infiltration Procedure details:    Location:  L subclavian   Patient position:  Supine   Procedural supplies:  Triple lumen   Number of attempts:  1   Successful placement: yes   Post-procedure details:    Post-procedure:  Dressing applied   Assessment:  Blood return through all ports   Procedure completion:  Tolerated

## 2020-10-06 ENCOUNTER — Emergency Department (HOSPITAL_COMMUNITY): Payer: Managed Care, Other (non HMO)

## 2020-10-06 DIAGNOSIS — I469 Cardiac arrest, cause unspecified: Secondary | ICD-10-CM

## 2020-10-06 DIAGNOSIS — R579 Shock, unspecified: Secondary | ICD-10-CM

## 2020-10-06 LAB — I-STAT ARTERIAL BLOOD GAS, ED
Acid-base deficit: 14 mmol/L — ABNORMAL HIGH (ref 0.0–2.0)
Bicarbonate: 19.4 mmol/L — ABNORMAL LOW (ref 20.0–28.0)
Calcium, Ion: 1.13 mmol/L — ABNORMAL LOW (ref 1.15–1.40)
HCT: 31 % — ABNORMAL LOW (ref 39.0–52.0)
Hemoglobin: 10.5 g/dL — ABNORMAL LOW (ref 13.0–17.0)
O2 Saturation: 11 %
Patient temperature: 97.6
Potassium: 3.9 mmol/L (ref 3.5–5.1)
Sodium: 138 mmol/L (ref 135–145)
TCO2: 22 mmol/L (ref 22–32)
pCO2 arterial: 91.4 mmHg (ref 32.0–48.0)
pH, Arterial: 6.931 — CL (ref 7.350–7.450)
pO2, Arterial: 17 mmHg — CL (ref 83.0–108.0)

## 2020-10-06 LAB — CBC
HCT: 35.8 % — ABNORMAL LOW (ref 39.0–52.0)
Hemoglobin: 10.7 g/dL — ABNORMAL LOW (ref 13.0–17.0)
MCH: 27.9 pg (ref 26.0–34.0)
MCHC: 29.9 g/dL — ABNORMAL LOW (ref 30.0–36.0)
MCV: 93.2 fL (ref 80.0–100.0)
Platelets: 232 10*3/uL (ref 150–400)
RBC: 3.84 MIL/uL — ABNORMAL LOW (ref 4.22–5.81)
RDW: 15.5 % (ref 11.5–15.5)
WBC: 27 10*3/uL — ABNORMAL HIGH (ref 4.0–10.5)
nRBC: 0.1 % (ref 0.0–0.2)

## 2020-10-06 LAB — BPAM FFP
Blood Product Expiration Date: 202209182359
Blood Product Expiration Date: 202209192359
Blood Product Expiration Date: 202209192359
Blood Product Expiration Date: 202209202359
ISSUE DATE / TIME: 202209150117
ISSUE DATE / TIME: 202209150117
ISSUE DATE / TIME: 202209150117
Unit Type and Rh: 6200
Unit Type and Rh: 6200
Unit Type and Rh: 6200
Unit Type and Rh: 6200

## 2020-10-06 LAB — PREPARE FRESH FROZEN PLASMA
Unit division: 0
Unit division: 0
Unit division: 0
Unit division: 0

## 2020-10-06 LAB — BASIC METABOLIC PANEL
Anion gap: 13 (ref 5–15)
BUN: 24 mg/dL — ABNORMAL HIGH (ref 6–20)
CO2: 20 mmol/L — ABNORMAL LOW (ref 22–32)
Calcium: 8 mg/dL — ABNORMAL LOW (ref 8.9–10.3)
Chloride: 104 mmol/L (ref 98–111)
Creatinine, Ser: 2.32 mg/dL — ABNORMAL HIGH (ref 0.61–1.24)
GFR, Estimated: 35 mL/min — ABNORMAL LOW (ref >60)
Glucose, Bld: 452 mg/dL — ABNORMAL HIGH (ref 70–99)
Potassium: 4.1 mmol/L (ref 3.5–5.1)
Sodium: 137 mmol/L (ref 135–145)

## 2020-10-06 LAB — TROPONIN I (HIGH SENSITIVITY): Troponin I (High Sensitivity): 357 ng/L (ref <18)

## 2020-10-06 LAB — RESP PANEL BY RT-PCR (FLU A&B, COVID) ARPGX2
Influenza A by PCR: NEGATIVE
Influenza B by PCR: NEGATIVE
SARS Coronavirus 2 by RT PCR: NEGATIVE

## 2020-10-06 LAB — LACTIC ACID, PLASMA: Lactic Acid, Venous: 2.1 mmol/L (ref 0.5–1.9)

## 2020-10-06 MED ORDER — ATROPINE SULFATE 1 MG/ML IJ SOLN
INTRAMUSCULAR | Status: AC | PRN
  Administered 2020-10-05: 1 mg via INTRAVENOUS

## 2020-10-06 MED ORDER — SODIUM CHLORIDE 0.9 % IV SOLN
INTRAVENOUS | Status: AC | PRN
  Administered 2020-10-06: 75 mL/h via INTRAVENOUS

## 2020-10-06 MED ORDER — NOREPINEPHRINE 4 MG/250ML-% IV SOLN
INTRAVENOUS | Status: AC | PRN
  Administered 2020-10-06: 200 ug/min via INTRAVENOUS
  Administered 2020-10-06: 20 ug/min via INTRAVENOUS

## 2020-10-06 MED ORDER — SODIUM BICARBONATE 8.4 % IV SOLN
INTRAVENOUS | Status: AC | PRN
  Administered 2020-10-06 (×2): 50 meq via INTRAVENOUS

## 2020-10-06 MED ORDER — EPINEPHRINE 1 MG/10ML IJ SOSY
PREFILLED_SYRINGE | INTRAMUSCULAR | Status: AC | PRN
  Administered 2020-10-06: 1 mg via INTRAVENOUS

## 2020-10-06 MED ORDER — SODIUM CHLORIDE 0.9 % IV SOLN
INTRAVENOUS | Status: AC | PRN
  Administered 2020-10-06 (×2): 1000 mL via INTRAVENOUS

## 2020-10-06 MED ORDER — VASOPRESSIN 20 UNIT/ML IV SOLN
INTRAVENOUS | Status: AC | PRN
  Administered 2020-10-06: .03 [IU] via INTRAVENOUS

## 2020-10-06 MED ORDER — VASOPRESSIN 20 UNITS/100 ML INFUSION FOR SHOCK
0.0000 [IU]/min | INTRAVENOUS | Status: DC
  Administered 2020-10-06: 0.03 [IU]/min via INTRAVENOUS
  Filled 2020-10-06: qty 100

## 2020-10-06 MED ORDER — EPINEPHRINE 1 MG/10ML IJ SOSY
PREFILLED_SYRINGE | INTRAMUSCULAR | Status: AC
  Filled 2020-10-06: qty 10

## 2020-10-06 MED ORDER — EPINEPHRINE 1 MG/10ML IJ SOSY
PREFILLED_SYRINGE | INTRAMUSCULAR | Status: AC | PRN
  Administered 2020-10-06 (×2): 1 mg via INTRAVENOUS

## 2020-10-06 MED ORDER — CALCIUM CHLORIDE 10 % IV SOLN
INTRAVENOUS | Status: AC | PRN
  Administered 2020-10-06: 1 g via INTRAVENOUS

## 2020-10-06 MED ORDER — EPINEPHRINE 1 MG/10ML IJ SOSY
PREFILLED_SYRINGE | INTRAMUSCULAR | Status: AC | PRN
  Administered 2020-10-05 – 2020-10-06 (×5): 1 mg via INTRAVENOUS

## 2020-10-06 MED ORDER — ETOMIDATE 2 MG/ML IV SOLN
INTRAVENOUS | Status: AC | PRN
  Administered 2020-10-05: 20 mg via INTRAVENOUS

## 2020-10-06 MED ORDER — ROCURONIUM BROMIDE 50 MG/5ML IV SOLN
INTRAVENOUS | Status: AC | PRN
  Administered 2020-10-06: 100 mg via INTRAVENOUS

## 2020-10-06 MED ORDER — NOREPINEPHRINE 16 MG/250ML-% IV SOLN
0.0000 ug/min | INTRAVENOUS | Status: DC
Start: 2020-10-06 — End: 2020-10-06
  Filled 2020-10-06: qty 250

## 2020-10-06 MED ORDER — NOREPINEPHRINE 4 MG/250ML-% IV SOLN
INTRAVENOUS | Status: AC
  Filled 2020-10-06: qty 250

## 2020-10-07 LAB — TYPE AND SCREEN
ABO/RH(D): O POS
Antibody Screen: NEGATIVE
Unit division: 0
Unit division: 0
Unit division: 0
Unit division: 0
Unit division: 0
Unit division: 0
Unit division: 0

## 2020-10-07 LAB — BPAM RBC
Blood Product Expiration Date: 202209252359
Blood Product Expiration Date: 202209282359
Blood Product Expiration Date: 202210112359
Blood Product Expiration Date: 202210152359
Blood Product Expiration Date: 202210152359
Blood Product Expiration Date: 202210152359
Blood Product Expiration Date: 202210162359
ISSUE DATE / TIME: 202209142350
ISSUE DATE / TIME: 202209150107
ISSUE DATE / TIME: 202209150107
ISSUE DATE / TIME: 202209150113
ISSUE DATE / TIME: 202209152030
ISSUE DATE / TIME: 202209160954
ISSUE DATE / TIME: 202209160954
Unit Type and Rh: 5100
Unit Type and Rh: 5100
Unit Type and Rh: 5100
Unit Type and Rh: 5100
Unit Type and Rh: 5100
Unit Type and Rh: 5100
Unit Type and Rh: 5100

## 2020-10-12 ENCOUNTER — Encounter: Payer: Managed Care, Other (non HMO) | Admitting: Physical Therapy

## 2020-10-17 ENCOUNTER — Encounter: Payer: Managed Care, Other (non HMO) | Admitting: Physical Therapy
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
## 2020-10-22 NOTE — ED Notes (Signed)
Time of Death 0141, confirmed by Dr. Elsworth Soho and Dr. Donne Hazel

## 2020-10-22 NOTE — Code Documentation (Signed)
Comfort Care on monitor initiated per MD

## 2020-10-22 NOTE — Code Documentation (Signed)
Type blood specific arrived.
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
## 2020-10-22 NOTE — ED Provider Notes (Signed)
Physical Exam  BP (!) 62/40   Pulse (!) 152   Resp (!) 22   SpO2 (!) 63%   Physical Exam  ED Course/Procedures     Procedure Name: Intubation Date/Time: 10-14-2020 12:56 AM Performed by: Arnaldo Natal, MD Pre-anesthesia Checklist: Patient identified, Patient being monitored, Emergency Drugs available, Timeout performed and Suction available Oxygen Delivery Method: Non-rebreather mask Preoxygenation: Pre-oxygenation with 100% oxygen Induction Type: Rapid sequence Ventilation: Mask ventilation without difficulty Laryngoscope Size: Glidescope and 4 Grade View: Grade II Tube size: 8.0 mm Number of attempts: 1 Airway Equipment and Method: Video-laryngoscopy Placement Confirmation: ETT inserted through vocal cords under direct vision, CO2 detector, Breath sounds checked- equal and bilateral and Positive ETCO2 Secured at: 24 cm Tube secured with: ETT holder Dental Injury: Teeth and Oropharynx as per pre-operative assessment     CPR  Date/Time: 10/14/20 12:58 AM Performed by: Arnaldo Natal, MD Authorized by: Arnaldo Natal, MD  CPR Procedure Details:      Amount of time prior to administration of ACLS/BLS (minutes):  0   ACLS/BLS initiated by EMS: No     CPR/ACLS performed in the ED: Yes     Duration of CPR (minutes):  120   Outcome: ROSC obtained    CPR performed via ACLS guidelines under my direct supervision.  See RN documentation for details including defibrillator use, medications, doses and timing. Comments:     Off and on CPR for about 2 hours prior to persistent ROSC. .Critical Care Performed by: Arnaldo Natal, MD Authorized by: Arnaldo Natal, MD   Critical care provider statement:    Critical care time (minutes):  120   Critical care time was exclusive of:  Separately billable procedures and treating other patients and teaching time   Critical care was necessary to treat or prevent imminent or life-threatening deterioration of the following conditions:   Cardiac failure, CNS failure or compromise, respiratory failure, circulatory failure, trauma and shock   Critical care was time spent personally by me on the following activities:  Discussions with consultants, evaluation of patient's response to treatment, examination of patient, ordering and performing treatments and interventions, ordering and review of laboratory studies, ordering and review of radiographic studies, pulse oximetry, re-evaluation of patient's condition, obtaining history from patient or surrogate and review of old charts   I assumed direction of critical care for this patient from another provider in my specialty: yes     Care discussed with: admitting provider    MDM  I  assumed care for this patient at shift change.  Shortly after shift change, I was called to the bedside.  The patient was decompensating.  He was pale, diaphoretic, and complaining of shortness of breath.  Blood pressure was recorded as 80/70, and his O2 sat was in the 80s.  I spoke with trauma surgeon, Dr. Donne Hazel, and decision was made to intubate the patient.  Simultaneously, trauma blood was hung.  Prior to intubation, the patient suffered a cardiac arrest.  Initial rhythm was PEA.  CPR was started.  Patient was intubated after ROSC was obtained.  However, after intubation, he suffered intermittent cardiac arrest and required periods of CPR.  After ROSC, he was given norepinephrine at increasing doses to maintain blood pressure.  He was placed on maximal ventilator settings.  Blood pressure remained suboptimal, and vasopressin was hung.  It was thought his primary pathology was respiratory.  He was not thought to be suffering from hemorrhagic shock.  Ultimately, the  decision was made to transfer him to the ICU.  However, he was pronounced dead by Dr. Donne Hazel in the ED prior to this occurring.  I spoke to the medical examiner to report the death.       Arnaldo Natal, MD 28-Oct-2020 [facility] [place]

## 2020-10-22 DEATH — deceased

## 2020-10-24 ENCOUNTER — Encounter: Payer: Managed Care, Other (non HMO) | Admitting: Physical Therapy

## 2020-10-31 ENCOUNTER — Encounter: Payer: Managed Care, Other (non HMO) | Admitting: Physical Therapy

## 2020-11-07 ENCOUNTER — Encounter: Payer: Managed Care, Other (non HMO) | Admitting: Physical Therapy

## 2020-11-14 ENCOUNTER — Encounter: Payer: Managed Care, Other (non HMO) | Admitting: Physical Therapy

## 2020-11-21 ENCOUNTER — Encounter: Payer: Managed Care, Other (non HMO) | Admitting: Physical Therapy

## 2023-07-27 IMAGING — CT CT HEAD W/O CM
4 series · 16 of 47 positions shown, 18 images · non-contrast
Comparison: None.

CLINICAL DATA: Trauma.

EXAM:
CT HEAD WITHOUT CONTRAST CT cervical spine without contrast.
TECHNIQUE: Contiguous axial images were obtained from the base of the skull
through the vertex without intravenous contrast.

[Series 3: head without · axial · non-contrast · 0.46mm/px · z∈[-562,-438]mm · 7 of 35 slices shown, 9 images]
[im 5/35  brain]
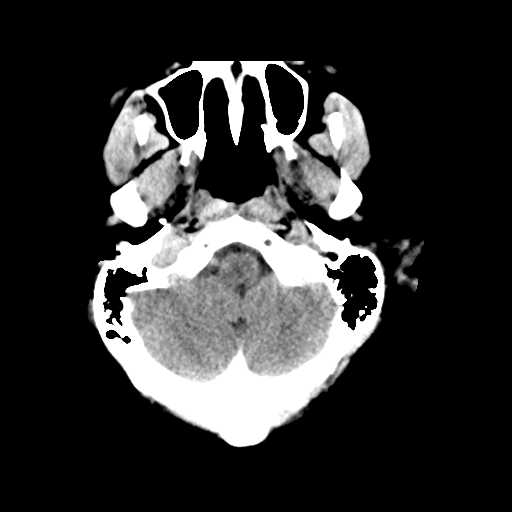
[im 5/35  bone]
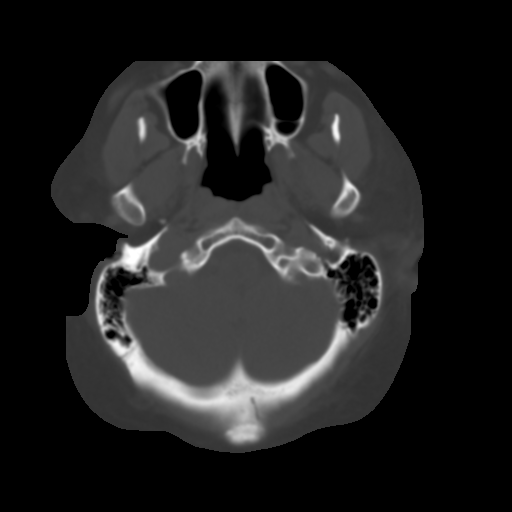
[im 9/35  brain]
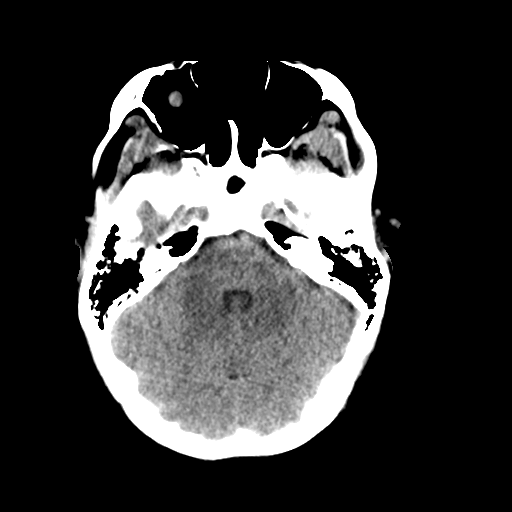
[im 13/35  brain]
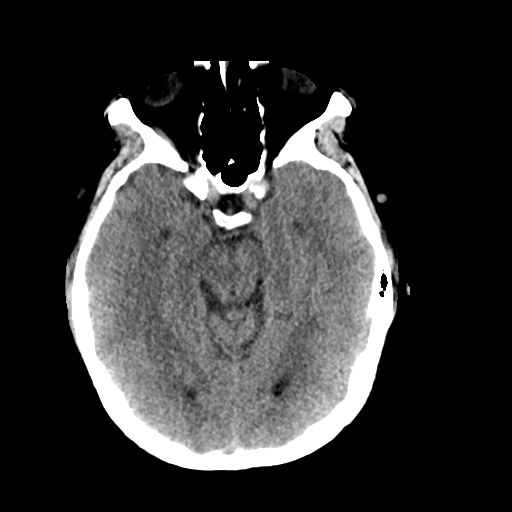
[im 18/35  brain]
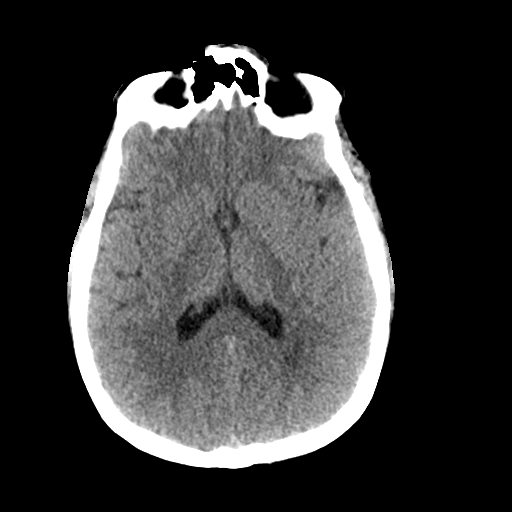
[im 22/35  brain]
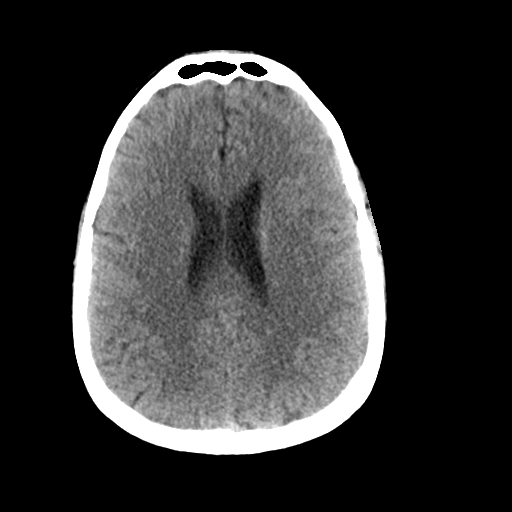
[im 22/35  bone]
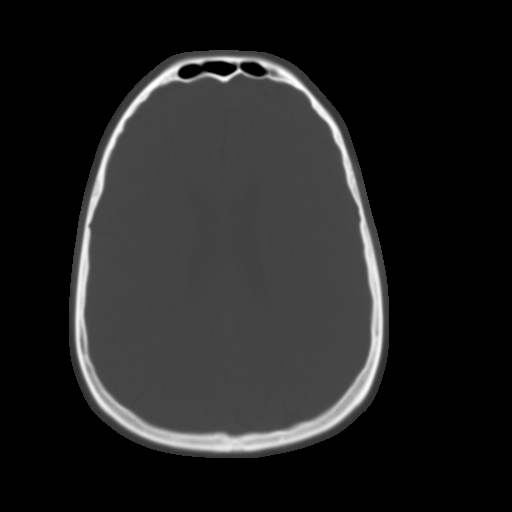
[im 26/35  brain]
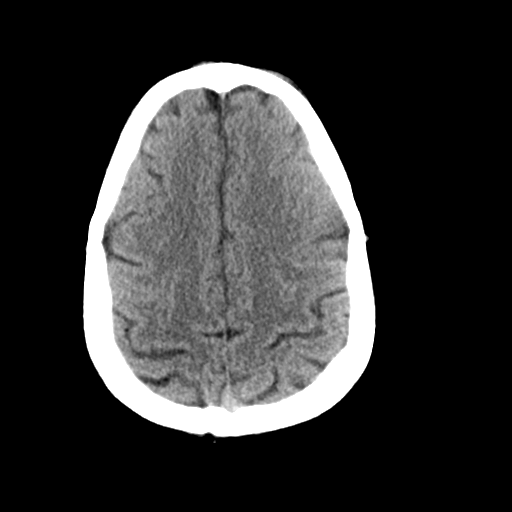
[im 30/35  brain]
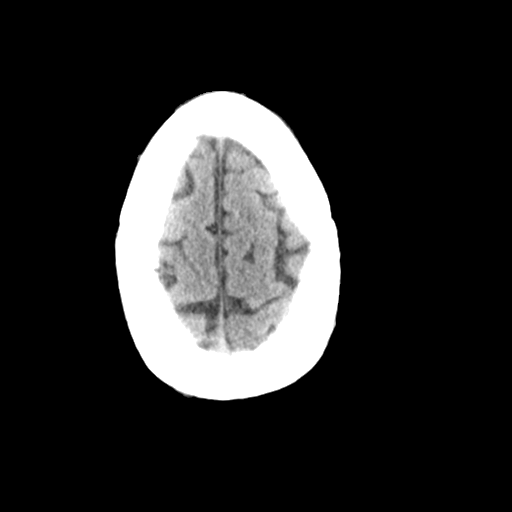

[Series 4: head bone · axial · 0.46mm/px · z∈[-566,-532]mm · 3 of 87 slices shown]
[im 9/87  bone]
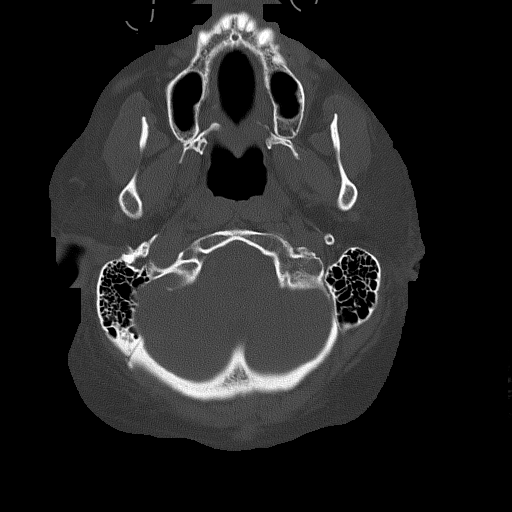
[im 18/87  bone]
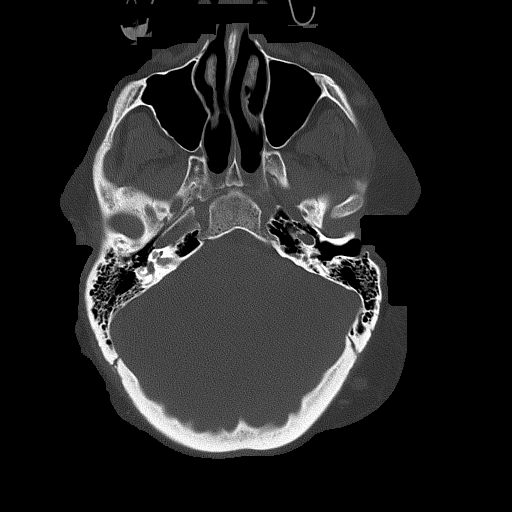
[im 26/87  bone]
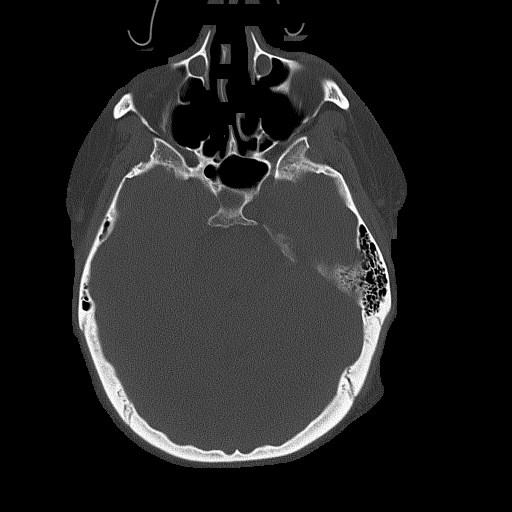

[Series 5: head without cor · coronal · non-contrast · 0.38mm/px · 3 of 64 slices shown]
[im 22/64  brain]
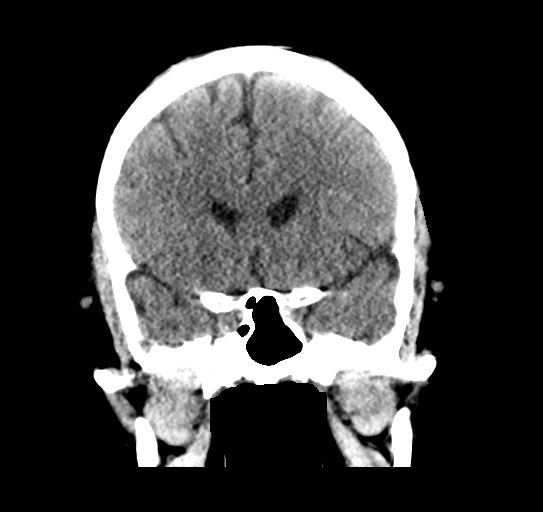
[im 29/64  brain]
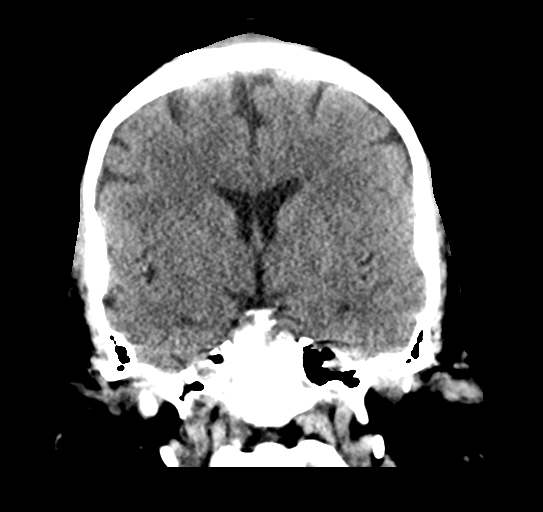
[im 36/64  brain]
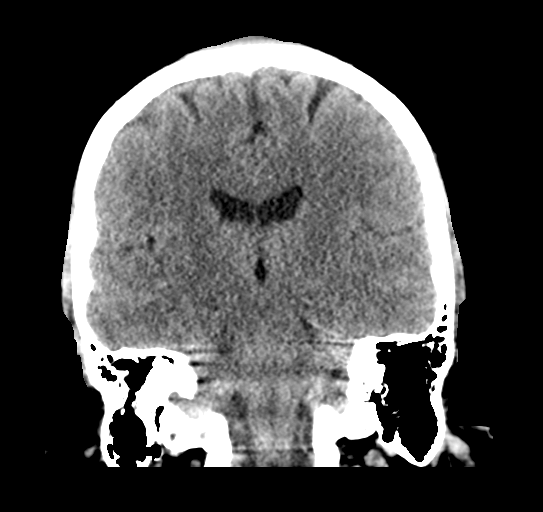

[Series 6: head without sag · sagittal · non-contrast · 0.42mm/px · 3 of 50 slices shown]
[im 17/50  brain]
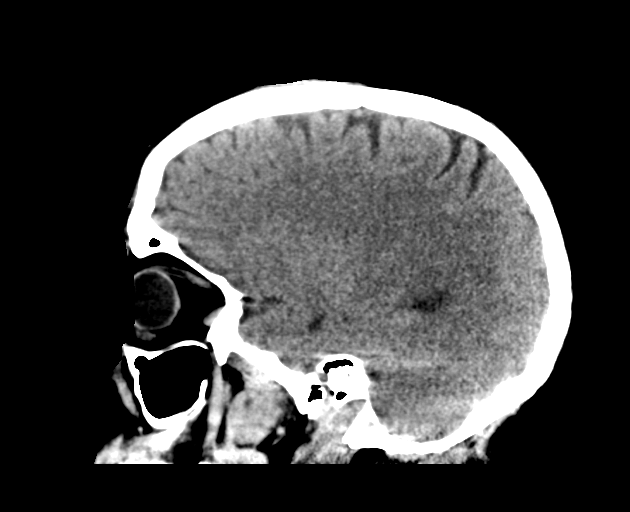
[im 25/50  brain]
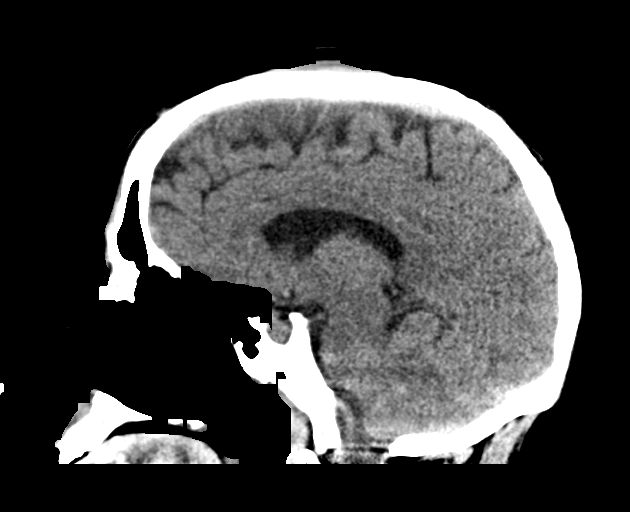
[im 33/50  brain]
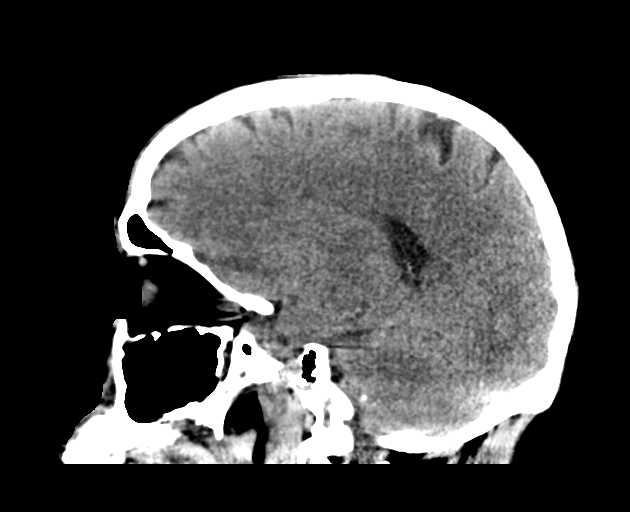

[16 of 47 positions shown; findings below may reference images not displayed]

FINDINGS: CT HEAD FINDINGS

Brain: No evidence of acute infarction, hemorrhage, hydrocephalus,
extra-axial collection or mass lesion/mass effect.

Vascular: No hyperdense vessel or unexpected calcification.

Skull: Normal. Negative for fracture or focal lesion.

Sinuses/Orbits: There is a polyp or mucous retention cyst in the
right maxillary sinus measuring 12 mm in diameter. The paranasal
sinuses and mastoid air cells are otherwise clear. The

Other: There is right frontal scalp soft tissue swelling. Small
cephalhematoma identified in the midline superior frontal parietal
region.

CT CERVICAL SPINE FINDINGS

Examination is technically limited secondary to body habitus.

Alignment: Normal.

Skull base and vertebrae: No acute fracture. No primary bone lesion
or focal pathologic process.

Soft tissues and spinal canal: No prevertebral fluid or swelling. No
visible canal hematoma.

Disc levels: No significant central canal or neural foraminal
stenosis at any level.

Upper chest: Limited secondary to motion artifact. There are chronic
appearing left first rib fractures with nonunion. There is an acute
appearing right first rib fracture. There questionable acute
bilateral posterior second rib fractures. Bilateral pleural
effusions are present. There are ground-glass opacities in the right
lung apex. Central venous catheter is partially visualized.

Other: None.
IMPRESSION: 1.  No acute intracranial process.

2. No acute fracture or traumatic subluxation of the cervical spine.

3. Findings worrisome for acute fractures of the second ribs
bilaterally. Likely right first acute rib fracture. Small bilateral
pleural effusions.

## 2023-07-27 IMAGING — CT CT CERVICAL SPINE W/O CM
3 series · 12 of 33 positions shown, 14 images · non-contrast
Comparison: None.

CLINICAL DATA: Trauma.

EXAM:
CT HEAD WITHOUT CONTRAST CT cervical spine without contrast.
TECHNIQUE: Contiguous axial images were obtained from the base of the skull
through the vertex without intravenous contrast.

[Series 3: c_spine 2.0 st · axial · 0.35mm/px · z∈[-720,-578]mm · 4 of 103 slices shown, 5 images]
[im 16/103  soft-tissue]
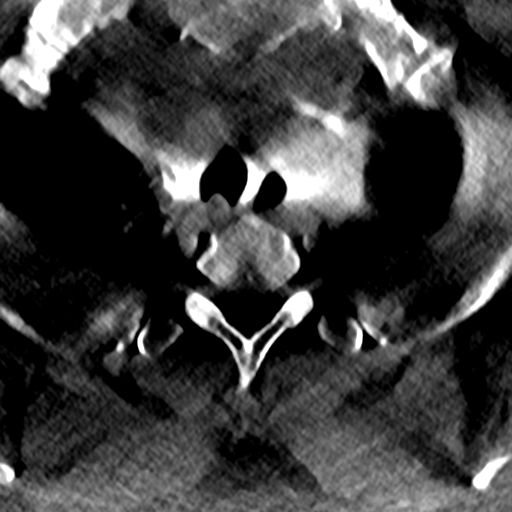
[im 16/103  bone]
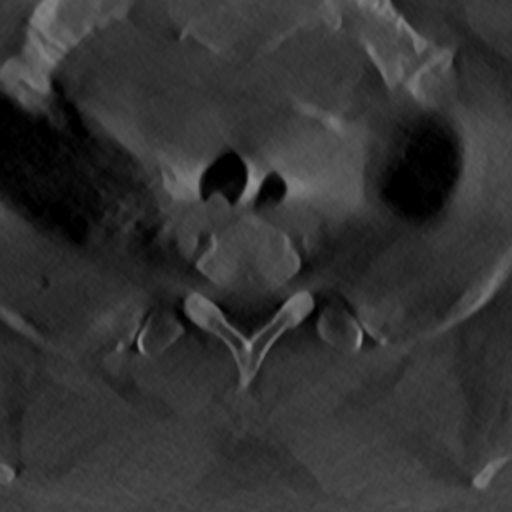
[im 40/103  bone]
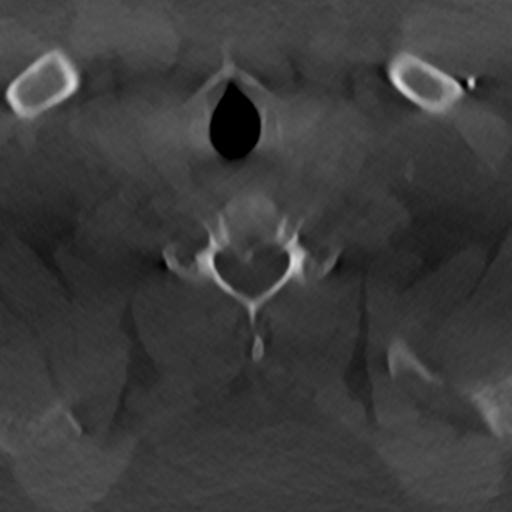
[im 63/103  bone]
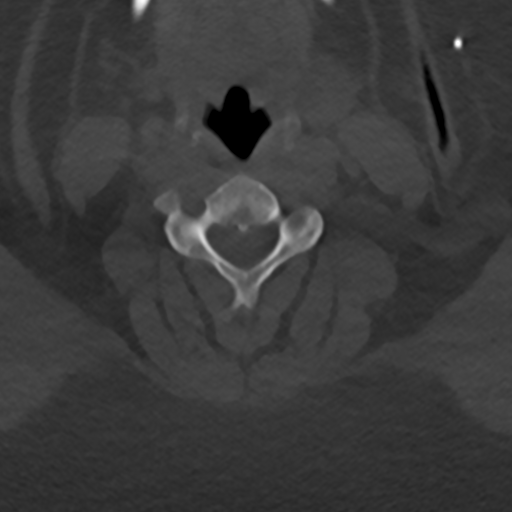
[im 87/103  bone]
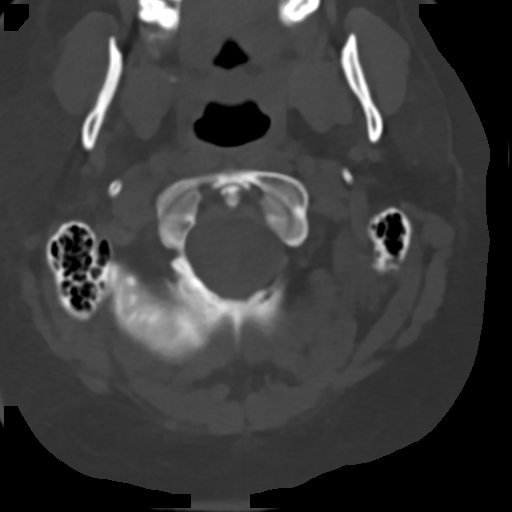

[Series 6: c_spine 2.0 sag bone · sagittal · 0.33mm/px · 5 of 44 slices shown, 6 images]
[im 15/44  bone]
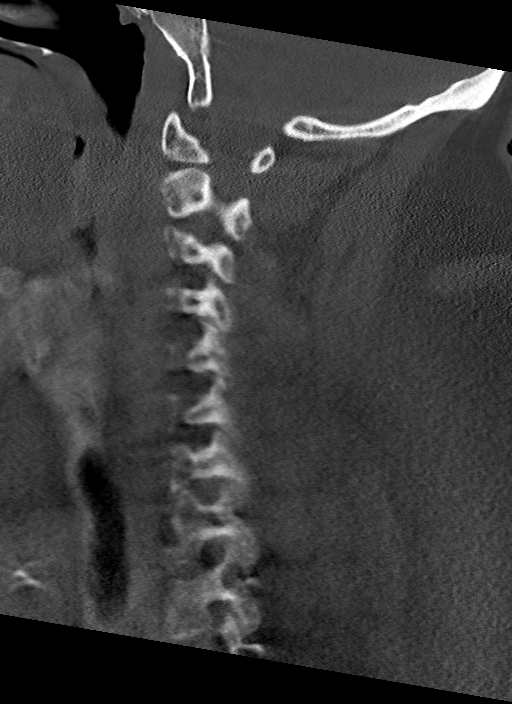
[im 18/44  bone]
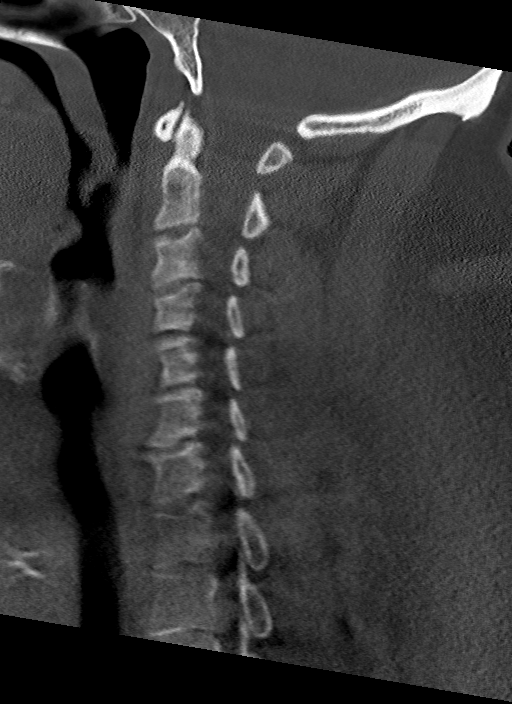
[im 22/44  soft-tissue]
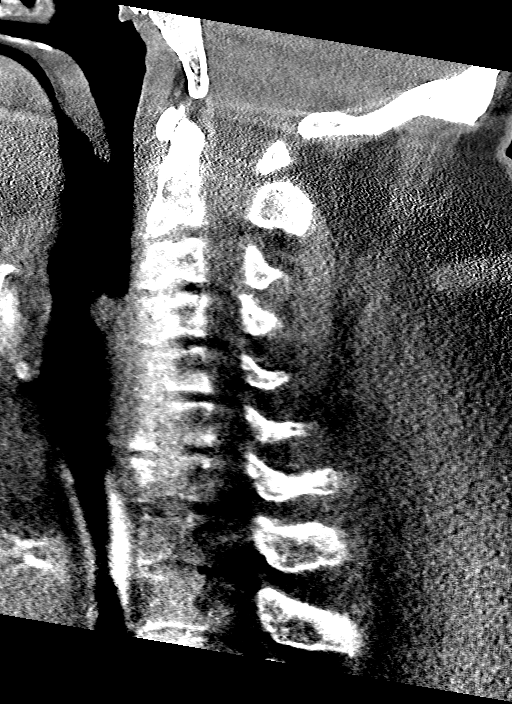
[im 22/44  bone]
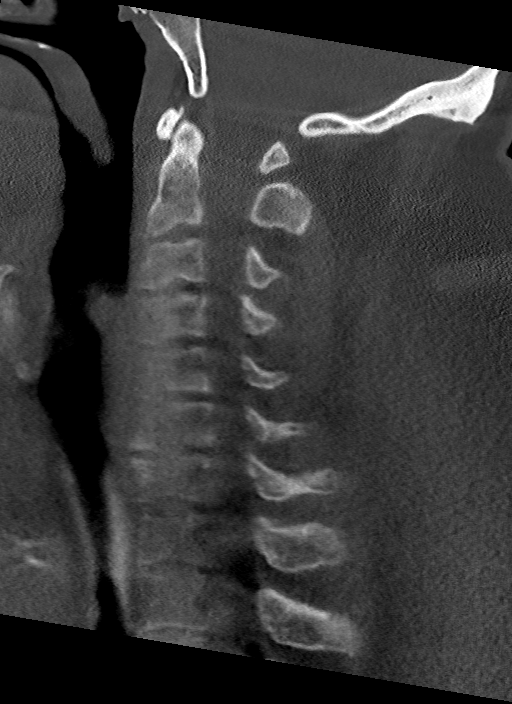
[im 26/44  bone]
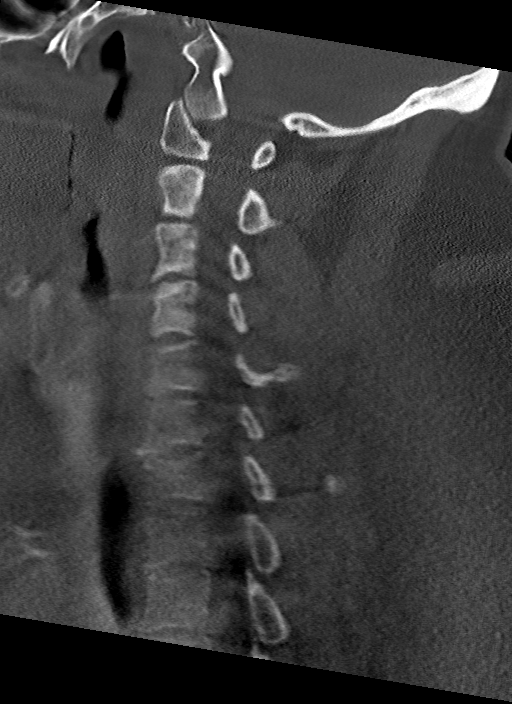
[im 29/44  bone]
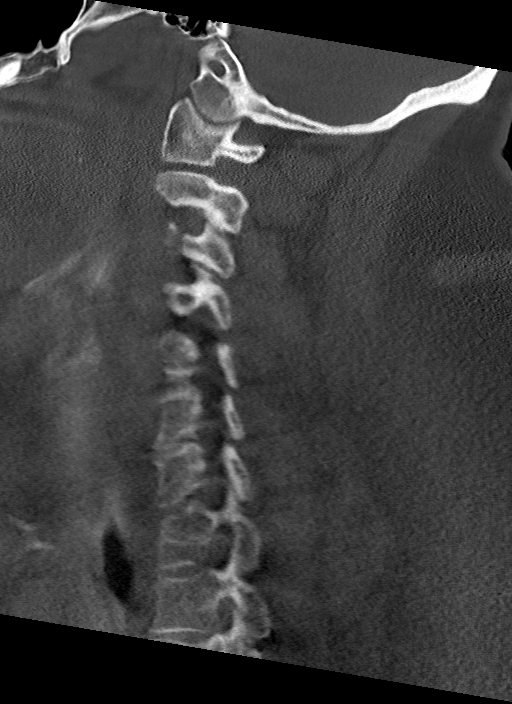

[Series 7: c_spine 2.0 cor bone · coronal · 0.32mm/px · 3 of 61 slices shown]
[im 13/61  bone]
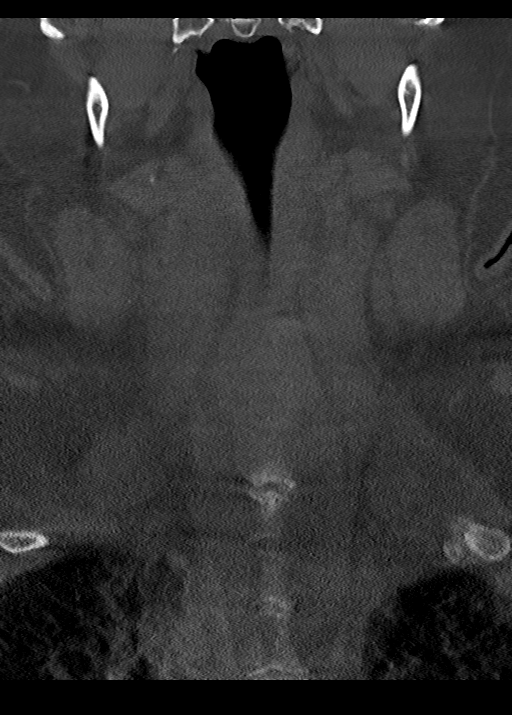
[im 25/61  bone]
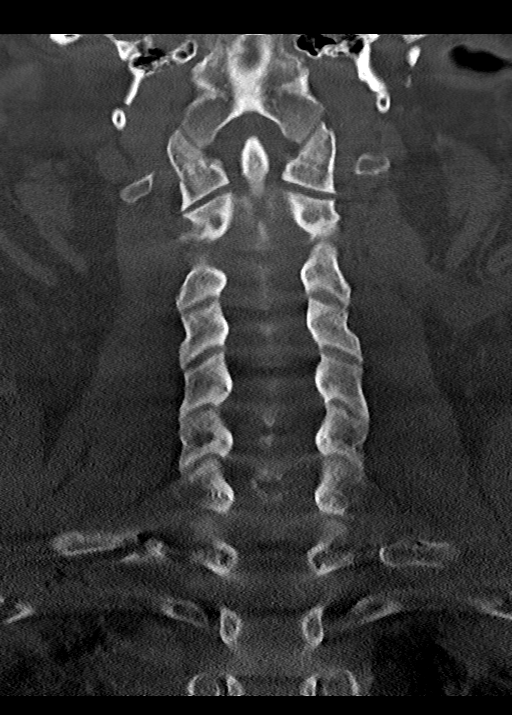
[im 37/61  bone]
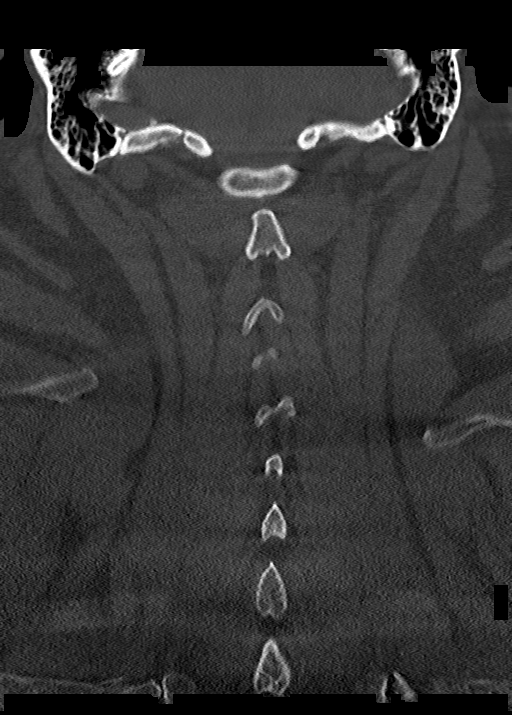

[12 of 33 positions shown; findings below may reference images not displayed]

FINDINGS: CT HEAD FINDINGS

Brain: No evidence of acute infarction, hemorrhage, hydrocephalus,
extra-axial collection or mass lesion/mass effect.

Vascular: No hyperdense vessel or unexpected calcification.

Skull: Normal. Negative for fracture or focal lesion.

Sinuses/Orbits: There is a polyp or mucous retention cyst in the
right maxillary sinus measuring 12 mm in diameter. The paranasal
sinuses and mastoid air cells are otherwise clear. The

Other: There is right frontal scalp soft tissue swelling. Small
cephalhematoma identified in the midline superior frontal parietal
region.

CT CERVICAL SPINE FINDINGS

Examination is technically limited secondary to body habitus.

Alignment: Normal.

Skull base and vertebrae: No acute fracture. No primary bone lesion
or focal pathologic process.

Soft tissues and spinal canal: No prevertebral fluid or swelling. No
visible canal hematoma.

Disc levels: No significant central canal or neural foraminal
stenosis at any level.

Upper chest: Limited secondary to motion artifact. There are chronic
appearing left first rib fractures with nonunion. There is an acute
appearing right first rib fracture. There questionable acute
bilateral posterior second rib fractures. Bilateral pleural
effusions are present. There are ground-glass opacities in the right
lung apex. Central venous catheter is partially visualized.

Other: None.
IMPRESSION: 1.  No acute intracranial process.

2. No acute fracture or traumatic subluxation of the cervical spine.

3. Findings worrisome for acute fractures of the second ribs
bilaterally. Likely right first acute rib fracture. Small bilateral
pleural effusions.

## 2023-07-28 IMAGING — DX DG CHEST 1V PORT
1 series · 1 of 1 positions shown · non-contrast
Comparison: October 05, 2020

CLINICAL DATA: Endotracheal tube placement.

EXAM:
PORTABLE CHEST 1 VIEW

[chest ap]
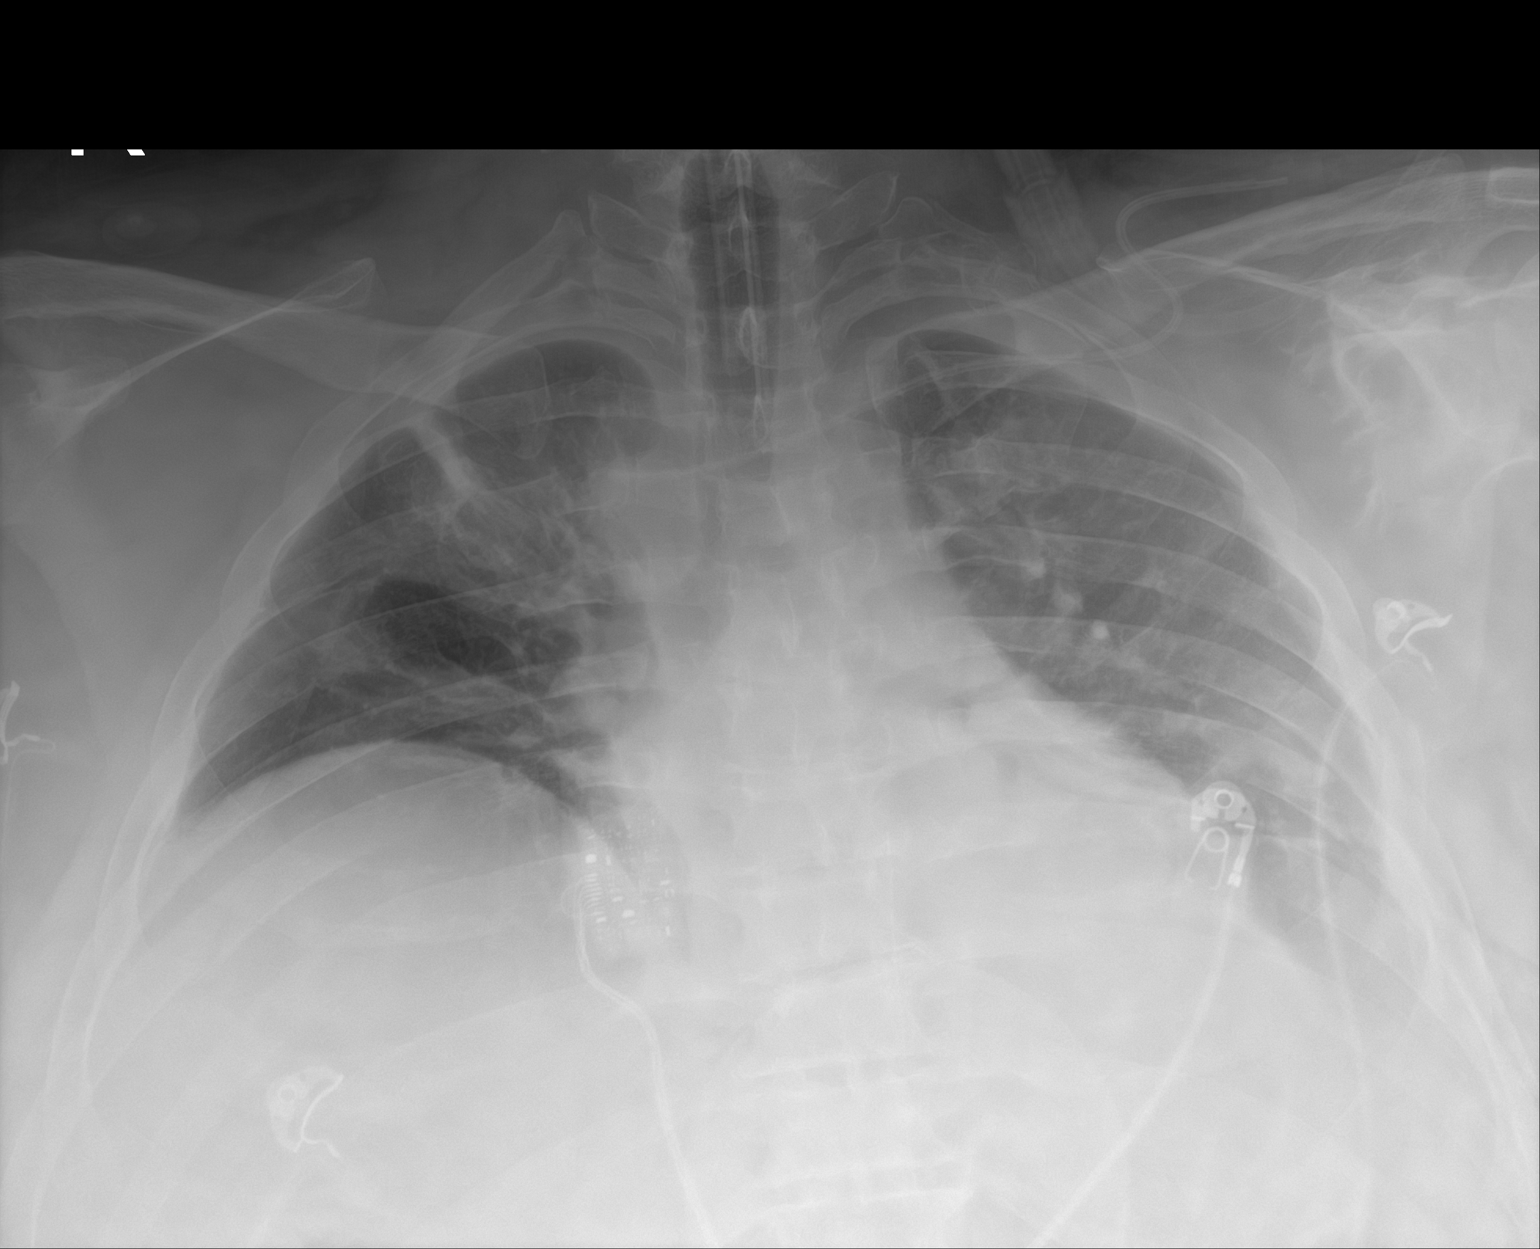

[1 of 1 positions shown; findings below may reference images not displayed]

FINDINGS: An endotracheal tube is present with its distal tip approximately
4.6 cm from the carina. Decreased lung volumes are noted. Mild to
moderate severity areas of linear atelectasis are seen within the
mid to upper right lung and bilateral lung bases. There is a small
left pleural effusion. No pneumothorax is identified. The heart size
and mediastinal contours are within normal limits. Acute second,
fifth, sixth and seventh left rib fractures are seen.
IMPRESSION: 1. Endotracheal tube positioning, as described above, with mild to
moderate severity bilateral linear atelectasis.
2. Small left pleural effusion.
3. Acute second, fifth, sixth and seventh left rib fractures.
